# Patient Record
Sex: Male | Born: 1995 | Race: Asian | Hispanic: No | Marital: Single | Smoking: Never smoker
Health system: Southern US, Community
[De-identification: ages and names within clinical notes are randomized; demographics above are authoritative.]

## PROBLEM LIST (undated history)

## (undated) DIAGNOSIS — N3281 Overactive bladder: Secondary | ICD-10-CM

## (undated) HISTORY — PX: WISDOM TOOTH EXTRACTION: SHX21

---

## 2017-08-08 HISTORY — PX: OTHER SURGICAL HISTORY: SHX169

## 2021-01-07 ENCOUNTER — Emergency Department (HOSPITAL_COMMUNITY): Payer: No Typology Code available for payment source

## 2021-01-07 ENCOUNTER — Inpatient Hospital Stay (HOSPITAL_COMMUNITY): Payer: No Typology Code available for payment source

## 2021-01-07 ENCOUNTER — Inpatient Hospital Stay (HOSPITAL_COMMUNITY)
Admission: EM | Admit: 2021-01-07 | Discharge: 2021-01-10 | DRG: 101 | Disposition: A | Discharge status: Home / Self Care | Payer: No Typology Code available for payment source | Attending: Family Medicine | Admitting: Family Medicine

## 2021-01-07 DIAGNOSIS — E872 Acidosis: Secondary | ICD-10-CM | POA: Diagnosis present

## 2021-01-07 DIAGNOSIS — G40109 Localization-related (focal) (partial) symptomatic epilepsy and epileptic syndromes with simple partial seizures, not intractable, without status epilepticus: Secondary | ICD-10-CM

## 2021-01-07 DIAGNOSIS — N179 Acute kidney failure, unspecified: Secondary | ICD-10-CM | POA: Diagnosis present

## 2021-01-07 DIAGNOSIS — G9349 Other encephalopathy: Secondary | ICD-10-CM | POA: Diagnosis present

## 2021-01-07 DIAGNOSIS — G40901 Epilepsy, unspecified, not intractable, with status epilepticus: Secondary | ICD-10-CM | POA: Diagnosis present

## 2021-01-07 DIAGNOSIS — Z20822 Contact with and (suspected) exposure to covid-19: Secondary | ICD-10-CM | POA: Diagnosis present

## 2021-01-07 DIAGNOSIS — G40909 Epilepsy, unspecified, not intractable, without status epilepticus: Secondary | ICD-10-CM | POA: Diagnosis not present

## 2021-01-07 DIAGNOSIS — R509 Fever, unspecified: Secondary | ICD-10-CM | POA: Diagnosis not present

## 2021-01-07 DIAGNOSIS — R739 Hyperglycemia, unspecified: Secondary | ICD-10-CM | POA: Diagnosis present

## 2021-01-07 DIAGNOSIS — R6510 Systemic inflammatory response syndrome (SIRS) of non-infectious origin without acute organ dysfunction: Secondary | ICD-10-CM | POA: Diagnosis present

## 2021-01-07 DIAGNOSIS — R569 Unspecified convulsions: Secondary | ICD-10-CM

## 2021-01-07 DIAGNOSIS — G40409 Other generalized epilepsy and epileptic syndromes, not intractable, without status epilepticus: Secondary | ICD-10-CM

## 2021-01-07 DIAGNOSIS — E860 Dehydration: Secondary | ICD-10-CM | POA: Diagnosis present

## 2021-01-07 DIAGNOSIS — N3281 Overactive bladder: Secondary | ICD-10-CM | POA: Diagnosis present

## 2021-01-07 DIAGNOSIS — K529 Noninfective gastroenteritis and colitis, unspecified: Secondary | ICD-10-CM | POA: Diagnosis present

## 2021-01-07 DIAGNOSIS — F329 Major depressive disorder, single episode, unspecified: Secondary | ICD-10-CM | POA: Diagnosis present

## 2021-01-07 DIAGNOSIS — I472 Ventricular tachycardia: Secondary | ICD-10-CM | POA: Diagnosis not present

## 2021-01-07 HISTORY — DX: Overactive bladder: N32.81

## 2021-01-07 LAB — CBC WITH DIFFERENTIAL/PLATELET
Abs Immature Granulocytes: 0.63 10*3/uL — ABNORMAL HIGH (ref 0.00–0.07)
Basophils Absolute: 0.1 10*3/uL (ref 0.0–0.1)
Basophils Relative: 0 %
Eosinophils Absolute: 0.2 10*3/uL (ref 0.0–0.5)
Eosinophils Relative: 1 %
HCT: 53.1 % — ABNORMAL HIGH (ref 39.0–52.0)
Hemoglobin: 15.5 g/dL (ref 13.0–17.0)
Immature Granulocytes: 3 %
Lymphocytes Relative: 22 %
Lymphs Abs: 5.2 10*3/uL — ABNORMAL HIGH (ref 0.7–4.0)
MCH: 26.6 pg (ref 26.0–34.0)
MCHC: 29.2 g/dL — ABNORMAL LOW (ref 30.0–36.0)
MCV: 91.2 fL (ref 80.0–100.0)
Monocytes Absolute: 1.3 10*3/uL — ABNORMAL HIGH (ref 0.1–1.0)
Monocytes Relative: 6 %
Neutro Abs: 16.4 10*3/uL — ABNORMAL HIGH (ref 1.7–7.7)
Neutrophils Relative %: 68 %
Platelets: 349 10*3/uL (ref 150–400)
RBC: 5.82 MIL/uL — ABNORMAL HIGH (ref 4.22–5.81)
RDW: 12.3 % (ref 11.5–15.5)
WBC: 23.9 10*3/uL — ABNORMAL HIGH (ref 4.0–10.5)
nRBC: 0 % (ref 0.0–0.2)

## 2021-01-07 LAB — PROTIME-INR
INR: 1.2 (ref 0.8–1.2)
Prothrombin Time: 15.2 seconds (ref 11.4–15.2)

## 2021-01-07 LAB — RESP PANEL BY RT-PCR (FLU A&B, COVID) ARPGX2
Influenza A by PCR: NEGATIVE
Influenza B by PCR: NEGATIVE
SARS Coronavirus 2 by RT PCR: NEGATIVE

## 2021-01-07 LAB — URINALYSIS, ROUTINE W REFLEX MICROSCOPIC
Bacteria, UA: NONE SEEN
Bilirubin Urine: NEGATIVE
Glucose, UA: 50 mg/dL — AB
Ketones, ur: NEGATIVE mg/dL
Leukocytes,Ua: NEGATIVE
Nitrite: NEGATIVE
Protein, ur: NEGATIVE mg/dL
Specific Gravity, Urine: 1.013 (ref 1.005–1.030)
pH: 5 (ref 5.0–8.0)

## 2021-01-07 LAB — CSF CELL COUNT WITH DIFFERENTIAL
RBC Count, CSF: 1 /mm3 — ABNORMAL HIGH
RBC Count, CSF: 22 /mm3 — ABNORMAL HIGH
Tube #: 1
Tube #: 4
WBC, CSF: 1 /mm3 (ref 0–5)
WBC, CSF: 1 /mm3 (ref 0–5)

## 2021-01-07 LAB — ETHANOL: Alcohol, Ethyl (B): <10 mg/dL (ref <10)

## 2021-01-07 LAB — COMPREHENSIVE METABOLIC PANEL
ALT: 23 U/L (ref 0–44)
AST: 35 U/L (ref 15–41)
Albumin: 4.3 g/dL (ref 3.5–5.0)
Alkaline Phosphatase: 91 U/L (ref 38–126)
Anion gap: 22 — ABNORMAL HIGH (ref 5–15)
BUN: 16 mg/dL (ref 6–20)
CO2: 15 mmol/L — ABNORMAL LOW (ref 22–32)
Calcium: 9.1 mg/dL (ref 8.9–10.3)
Chloride: 100 mmol/L (ref 98–111)
Creatinine, Ser: 1.82 mg/dL — ABNORMAL HIGH (ref 0.61–1.24)
GFR, Estimated: 53 mL/min — ABNORMAL LOW (ref >60)
Glucose, Bld: 223 mg/dL — ABNORMAL HIGH (ref 70–99)
Potassium: 5 mmol/L (ref 3.5–5.1)
Sodium: 137 mmol/L (ref 135–145)
Total Bilirubin: 0.9 mg/dL (ref 0.3–1.2)
Total Protein: 7 g/dL (ref 6.5–8.1)

## 2021-01-07 LAB — LACTIC ACID, PLASMA
Lactic Acid, Venous: >11 mmol/L (ref 0.5–1.9)
Lactic Acid, Venous: 1.7 mmol/L (ref 0.5–1.9)

## 2021-01-07 LAB — RAPID URINE DRUG SCREEN, HOSP PERFORMED
Amphetamines: NOT DETECTED
Barbiturates: NOT DETECTED
Benzodiazepines: POSITIVE — AB
Cocaine: NOT DETECTED
Opiates: NOT DETECTED
Tetrahydrocannabinol: NOT DETECTED

## 2021-01-07 LAB — APTT: aPTT: 33 seconds (ref 24–36)

## 2021-01-07 LAB — GLUCOSE, CSF: Glucose, CSF: 62 mg/dL (ref 40–70)

## 2021-01-07 LAB — MAGNESIUM: Magnesium: 2 mg/dL (ref 1.7–2.4)

## 2021-01-07 LAB — PROTEIN, CSF: Total  Protein, CSF: 23 mg/dL (ref 15–45)

## 2021-01-07 MED ORDER — LORAZEPAM 2 MG/ML IJ SOLN
1.0000 mg | Freq: Once | INTRAMUSCULAR | Status: AC
  Administered 2021-01-07: 1 mg via INTRAVENOUS
  Filled 2021-01-07: qty 1

## 2021-01-07 MED ORDER — ACETAMINOPHEN 650 MG RE SUPP
650.0000 mg | Freq: Once | RECTAL | Status: AC
  Administered 2021-01-07: 650 mg via RECTAL
  Filled 2021-01-07: qty 1

## 2021-01-07 MED ORDER — DEXAMETHASONE SODIUM PHOSPHATE 10 MG/ML IJ SOLN
10.0000 mg | Freq: Once | INTRAMUSCULAR | Status: AC
Start: 2021-01-07 — End: 2021-01-07
  Administered 2021-01-07: 10 mg via INTRAVENOUS
  Filled 2021-01-07: qty 1

## 2021-01-07 MED ORDER — ENOXAPARIN SODIUM 40 MG/0.4ML IJ SOSY
40.0000 mg | PREFILLED_SYRINGE | INTRAMUSCULAR | Status: DC
  Administered 2021-01-08 – 2021-01-09 (×3): 40 mg via SUBCUTANEOUS
  Filled 2021-01-07 (×3): qty 0.4

## 2021-01-07 MED ORDER — LACTATED RINGERS IV BOLUS (SEPSIS)
1000.0000 mL | Freq: Once | INTRAVENOUS | Status: AC
  Administered 2021-01-07: 1000 mL via INTRAVENOUS

## 2021-01-07 MED ORDER — SODIUM CHLORIDE 0.9 % IV SOLN: INTRAVENOUS | Status: DC

## 2021-01-07 MED ORDER — DEXTROSE 5 % IV SOLN
10.0000 mg/kg | Freq: Three times a day (TID) | INTRAVENOUS | Status: DC
  Administered 2021-01-07 – 2021-01-09 (×6): 1000 mg via INTRAVENOUS
  Filled 2021-01-07 (×8): qty 20

## 2021-01-07 MED ORDER — LEVETIRACETAM IN NACL 1500 MG/100ML IV SOLN
1500.0000 mg | Freq: Once | INTRAVENOUS | Status: AC
  Administered 2021-01-07: 1500 mg via INTRAVENOUS
  Filled 2021-01-07: qty 100

## 2021-01-07 MED ORDER — VANCOMYCIN HCL 1750 MG/350ML IV SOLN
1750.0000 mg | Freq: Once | INTRAVENOUS | Status: AC
  Administered 2021-01-07: 1750 mg via INTRAVENOUS
  Filled 2021-01-07: qty 350

## 2021-01-07 MED ORDER — LACTATED RINGERS IV SOLN: INTRAVENOUS | Status: AC

## 2021-01-07 MED ORDER — SODIUM CHLORIDE 0.9 % IV SOLN
2.0000 g | Freq: Two times a day (BID) | INTRAVENOUS | Status: DC
  Administered 2021-01-08: 2 g via INTRAVENOUS
  Filled 2021-01-07: qty 2
  Filled 2021-01-07: qty 20

## 2021-01-07 MED ORDER — VANCOMYCIN HCL 1250 MG/250ML IV SOLN
1250.0000 mg | Freq: Two times a day (BID) | INTRAVENOUS | Status: DC
  Administered 2021-01-08: 1250 mg via INTRAVENOUS
  Filled 2021-01-07: qty 250

## 2021-01-07 MED ORDER — SODIUM CHLORIDE 0.9 % IV SOLN
2.0000 g | Freq: Once | INTRAVENOUS | Status: AC
  Administered 2021-01-07: 2 g via INTRAVENOUS
  Filled 2021-01-07: qty 20

## 2021-01-07 MED ORDER — ACETAMINOPHEN 325 MG RE SUPP
325.0000 mg | Freq: Once | RECTAL | Status: AC
  Administered 2021-01-07: 325 mg via RECTAL
  Filled 2021-01-07: qty 1

## 2021-01-07 MED ORDER — SODIUM CHLORIDE 0.9 % IV BOLUS: 1000.0000 mL | Freq: Once | INTRAVENOUS | Status: DC

## 2021-01-07 MED ORDER — LACTATED RINGERS IV BOLUS (SEPSIS)
500.0000 mL | Freq: Once | INTRAVENOUS | Status: AC
Start: 2021-01-07 — End: 2021-01-07
  Administered 2021-01-07: 500 mL via INTRAVENOUS

## 2021-01-07 NOTE — ED Notes (Signed)
Attempted to call report

## 2021-01-07 NOTE — Progress Notes (Signed)
NAME:  George Valentine, MRN:  [id number], DOB:  17-Jan-1996, LOS: 0 ADMISSION DATE:  01/07/2021, CONSULTATION DATE:  01/07/2021 REFERRING MD:  Graciella Freer, PA, CHIEF COMPLAINT:  HA/ AMS/ seizure  History of Present Illness:  HPI obtained from medical chart review, and per mother at beside (speaks some [place]), and sister by phone as patient remains encephalopathic/ post-ictal.   25 year old male with hx of febrile seizures at the age of 3 and additional seizure in high school but not on any AEDs, presenting with AMS/ witness seizure after patient complained of headache while at the gym at [facility].  Became unresponsive, noted to be seizing on EMS arrival, treated with versed and nasal trumpet inserted for snoring respirations.    Family reports patient was in his normal state of health as of this morning.  No recent complaints or known sick contacts.  Mother at bedside states he is healthy otherwise, denies ETOH, tobacco or illicit drug abuse   Was noted to be febrile on arrival 102.2, normotensive, tachycardic and currently on on room air with normal O2 saturations.  Witnessed seizure again in ER with left gaze s/p ativan.  Labs significant for bicarb 15, glucose 223, sCr 1.82, lactic acid >11, WBC 23.9, normal coags, COVID/ flu neg, ETOH neg, CXR normal, EKG ST/ non acute,  CT head neg.  EEG just completed.  LP completed so far - noted to be clear, glucose 62, RBC 22-> 1, protein 23, with HSV, gram stain, and culture pending.  Neurology consulted.  Loaded with keppra.  Cultures sent and empiric ceftriaxone/ vanc started with acyclovir and decadron.  S/p 3.5L NS.    PCCM called to evaluate for possible ICU admission.   Pertinent  Medical History  unknown  Significant Hospital Events: Including procedures, antibiotic start and stop dates in addition to other pertinent events    [age] presented with HA/ unresponsive/ witnessed seizure/ fever, AKI, lactic acidosis.  Pan cultured.  CTH neg.  LP  performed, empiric abx- vanc, ceftriaxone, acyclovir, decadron, neurology consulted.  EEG pending.  Interim History / Subjective:  EEG finishing.    Objective   Blood pressure 116/61, pulse (!) 107, temperature (!) 102.2 F (39 C), temperature source Rectal, resp. rate 20, height 6' (1.829 m), weight 99.8 kg, SpO2 93 %.    >       No intake or output data in the 24 hours ending 01/07/21 1823    Filed Weights   01/07/21 1600  Weight: 99.8 kg    Examination: General: Somnolent but arousable to command HEENT: MM pink/moist; PERRL Neuro: Opens eyes and moves UE and LE to command. Still somnolent but recently received dose of Ativan CV: s1s2, no m/r/g PULM:  Dim clear BS bilaterally on RA GI: soft, bsx4 active  Extremities: warm/dry, no edema  Skin: no rashes or lesions   Labs/imaging that I havepersonally reviewed  (right click and "Reselect all SmartList Selections" daily)   - CBC, BMET, ETOH, CTH, LP, SARS, CTH, CXR, EKG   Resolved Hospital Problem list     Assessment & Plan:   Seizure, hx of childhood febrile seizures Possible Mengitis AKI with AGMA,  Lactic acidosis: likely related to seizure Leukocytosis; fever SIRS Hyperglycemia Plan: -Patient is currently hemodynamically stable and protecting airway. Not ICU needs at this time -Neuro consulted -EEG in place; CT Head performed  -CSF cultures pending: treating ppx with Vanc, ceftriaxone, acyclovir, and decadron -prn tylenol -follow cultures -UDS, CK, UA and repeat lactate pending  Discussed with Lou Cal in ER remain per her primary/admitting team. PCCM available as needed   Labs   CBC: Recent Labs  Lab 01/07/21 1516  WBC 23.9*  NEUTROABS 16.4*  HGB 15.5  HCT 53.1*  MCV 91.2  PLT 349    Basic Metabolic Panel: Recent Labs  Lab 01/07/21 1516  NA 137  K 5.0  CL 100  CO2 15*  GLUCOSE 223*  BUN 16  CREATININE 1.82*  CALCIUM 9.1  MG 2.0   GFR: Estimated Creatinine Clearance:  76.6 mL/min (A) (by C-G formula based on SCr of 1.82 mg/dL (H)). Recent Labs  Lab 01/07/21 1516 01/07/21 1520  WBC 23.9*  --   LATICACIDVEN  --  >11.0*    Liver Function Tests: Recent Labs  Lab 01/07/21 1516  AST 35  ALT 23  ALKPHOS 91  BILITOT 0.9  PROT 7.0  ALBUMIN 4.3   No results for input(s): LIPASE, AMYLASE in the last 168 hours. No results for input(s): AMMONIA in the last 168 hours.  ABG No results found for: PHART, PCO2ART, PO2ART, HCO3, TCO2, ACIDBASEDEF, O2SAT   Coagulation Profile: Recent Labs  Lab 01/07/21 1516  INR 1.2    Cardiac Enzymes: No results for input(s): CKTOTAL, CKMB, CKMBINDEX, TROPONINI in the last 168 hours.  HbA1C: No results found for: HGBA1C  CBG: No results for input(s): GLUCAP in the last 168 hours.  Review of Systems:   See HPI. Information gathered from chart, bedside nurse, and family at bedside  Past Medical History:  He,  has no past medical history on file.   Surgical History:     Social History:      Family History:  His family history is not on file.   Allergies Not on File   Home Medications  Prior to Admission medications   Not on File     Critical care time: 88    JD Anselm [facility] 01/07/2021, 7:06 PM  Please see [email or web address] for pager details.  From 7A-7P if no response, please call [phone]. After hours, please call ELink [phone].

## 2021-01-07 NOTE — ED Notes (Signed)
Pt becoming more alert. Trying to climb out of the bed. Assisted back into bed. Pt not tolerating monitoring at this time.

## 2021-01-07 NOTE — Progress Notes (Signed)
EEG complete - results pending 

## 2021-01-07 NOTE — ED Triage Notes (Signed)
Pt bib ems from school gym, pt asked staff for tylenol, then sat down in chair. Pt had full body seizure with EMS, given 5mg  IM versed. Pt remains unresponsive. Pt on NRB at 90%, CBG 90. HR ST 148.

## 2021-01-07 NOTE — Progress Notes (Signed)
Responded to ED page to support patient who was brought to ED unresponsive after experiencing seizure and taking tylenol.  A friend got in contact with a cousin but no one has call to ask about him. Patient had not been here before.  Provided prayer and emotional support to patient and staff. Chaplain available as needed.  Venida Jarvis, [place], [facility], Pager [phone]

## 2021-01-07 NOTE — ED Notes (Signed)
RN attempted to call report 

## 2021-01-07 NOTE — ED Notes (Signed)
NRB removed. Pt oxygen saturation remains 94% and above. PA at the bedside updating family.

## 2021-01-07 NOTE — ED Notes (Signed)
Neuro at the bedside.

## 2021-01-07 NOTE — H&P (Addendum)
Family Medicine Teaching [place] Admission History and Physical Service Pager: [phone]  Patient name: George Valentine Medical record number: [id number] Date of birth: [birth year] Age: 25 y.o. Gender: male  Primary Care Provider: Pcp, No Consultants: Neuro Code Status: Full Preferred Emergency Contact: Mother  Chief Complaint: Seizure  Assessment and Plan: George Valentine is a 25 y.o. male presenting with seixure. PMH is significant for MDD, Overactive Bladder, and Flexural Exema.   Seizure-like activity with Sepsis r/o Patient presented after having seizure-like episode at gym around 1400.  Got Versed during EMS transport which resolved seizure. In ED had repeat seizure-like episodes. Received Ativan x2, Dexamethesone x1, and Keppra x1.  Received 3.5 1L bolus of LR. Unknown total duration of seizures.  Hemodynamically stable.  Currently Afebrile, mildly tachycardic in 100's.  On exam, patient somewhat somnolent but can be aroused to speech and answers questions appropriately.  Likely due to post-ictal state and benzodiazapoene administration. Neuro exam normal and no nuchal rigidity.  WBC elevated at 23.9.  Absolute neutrophils elevated at 16.3, rest of absolute lines also elevated.  Mg normal.  TSH on 10/21 was 1.401.  Ethanol negative and UDS negative outside of benzodiazapenes.   Patient denies any drug use or sexual activity.  Denies taking any medications but does use creatine and pre-workout.  Initial lactic acid elevated >11, but repeat PT, PTT normal.  BCx, UCx and CSF analysis all obtained.  CSF Glucose and Protein normal.  Neuro consulted and following.  CCM saw and believed stepdown care appropriate.  Head CT shows no active bleed.  Concern for Bacterial vs. Viral meningitis given fevers and prolonged seizures.   Currently on Vancomycin, Ceftriaxone, and Acyclovir.  Patient does indicate having one episode of seizure when younger and chart history of febrile seizures as young child.   Possible due to undiagnosed epilepsy.  Given workout and creatine usage, elevated Temp and tachycardia concern for dehydration/heat exhaustion, lowering seizure threshold causing this.  No sign of any active bleed on CT exam so less concern for Hematologic process.  Admission for observation and continued seizure work-up.        - Admit to inpatient, FPTS, Attending Dr Deirdre Priest - Neuro following, appreciate recs - Continue IV Acyclovir until HSV results - Deescalate  Antibiotics as appropriate - follow up BCx, UCx, CSF culture, HSV and MRI - follow up EEG results - No Ativan PRN ordered at this time - AM BMP, CBC, and A1C - CK BID - continue mIV LR  Overactive Bladder Has Permanent InterStim bladder [employer] device placed on 04/30/2018.  Sees Urologist yearly.    - Obtain A1C  FEN/GI: NPO Prophylaxis: Lovenox   Disposition: Progressive  History of Present Illness:  George Valentine is a 25 y.o. male presenting with seizure.  Indicates was at the gym around 2 PM this afternoon and started having having chills headache and dizziness.  Was weight lifting at the time.  Had 4 episodes of diarrhea earlier in day after eating [place] food night before.  Denies any head trauma.  Denies any sexual activity or drug use.  Does not use any medications but uses Pre-Workout and Creatine.  Endorses headache.  Denies any neck pain or stiffness.  Does endorse being around friend with possible recent viral illness earlier this week.  No other sick contacts. Indicates has had 1 episode of seizure 7 years ago.  Review Of Systems: Per HPI with the following additions:  Review of Systems  Constitutional: Positive for activity change and chills.  Respiratory: Negative for chest tightness and shortness of breath.   Cardiovascular: Negative for chest pain.  Gastrointestinal: Positive for diarrhea. Negative for nausea and vomiting.  Musculoskeletal: Negative for neck pain and neck stiffness.  Neurological:  Positive for dizziness and seizures.  Psychiatric/Behavioral: Positive for confusion.     Patient Active Problem List   Diagnosis Date Noted  . Seizure (HCC) 01/07/2021  . Status epilepticus [facility])     Past Medical History: No past medical history on file.  Past Surgical History:   Social History:   Additional social history: Not currently in school or has longt Please also refer to relevant sections of EMR.  Family History: No family history on file.  No reported Family Hx of seizures  Allergies and Medications: No Known Allergies No current facility-administered medications on file prior to encounter.   Current Outpatient Medications on File Prior to Encounter  Medication Sig Dispense Refill  . mometasone (ELOCON) 0.1 % cream Apply 1 application topically See admin instructions. Apply to affected area two times a day for no more than 2 weeks      Objective: BP (!) 111/54   Pulse (!) 106   Temp (!) 102.2 F (39 C) (Rectal)   Resp (!) 24   Ht 6' (1.829 m)   Wt 99.8 kg   SpO2 99%   BMI 29.84 kg/m  Exam:  Physical Exam Constitutional:      General: He is not in acute distress.    Appearance: Normal appearance. He is not diaphoretic.     Comments: Answers questions appropriately, somewhat somnolent  HENT:     Head: Normocephalic and atraumatic.     Mouth/Throat:     Mouth: Mucous membranes are moist.  Eyes:     Pupils: Pupils are equal, round, and reactive to light.  Cardiovascular:     Rate and Rhythm: Normal rate and regular rhythm.     Pulses: Normal pulses.  Pulmonary:     Effort: Pulmonary effort is normal.     Breath sounds: Normal breath sounds.  Musculoskeletal:     Cervical back: Normal range of motion. No rigidity or tenderness.     Right lower leg: No edema.     Left lower leg: No edema.  Lymphadenopathy:     Cervical: No cervical adenopathy.  Skin:    General: Skin is warm.  Neurological:     General: No focal deficit present.      Mental Status: He is oriented to person, place, and time.     Cranial Nerves: No cranial nerve deficit.     Motor: No weakness.  Psychiatric:        Mood and Affect: Mood normal.     Labs and Imaging: CBC BMET  Recent Labs  Lab 01/07/21 1516  WBC 23.9*  HGB 15.5  HCT 53.1*  PLT 349   Recent Labs  Lab 01/07/21 1516  NA 137  K 5.0  CL 100  CO2 15*  BUN 16  CREATININE 1.82*  GLUCOSE 223*  CALCIUM 9.1     EKG: Sinus Ruthine Dose, MD 01/07/2021, 9:37 PM PGY-1, [facility] Family Medicine FPTS Intern pager: [phone], text pages welcome

## 2021-01-07 NOTE — Consult Note (Signed)
NEUROHOSPITALISTS-CONSULTATION NOTE    Requesting Physician: Dr. Particia Nearing   Admit date: 01/07/21     Chief Complaint: Seizure    History obtained from:  Patient/Mother/Chart Review    HPI   George Valentine is a 25 year old male w/pmh of overactive bladder treated with implantation of permanent InterSim in 2019, depression, difficulty concentrating who presents with seizure witnessed by EMS.   He was at the [facility] gym and went to ask for some tylenol because he was not feeling well. After asking for tylenol he became unresponsive thus EMS was called. When EMS arrived he was seizing.   Per ED documentation he had another seizure while in the ER:  Was noted to be febrile on arrival 102.2, normotensive, tachycardic and currently on on room air with normal O2 saturations. Witnessed seizure again in ER with left gaze s/p ativan. Labs significant for bicarb 15, glucose 223, sCr 1.82, lactic acid >11, WBC 23.9, normal coags, COVID/ flu neg, ETOH neg, CXR normal, EKG ST/ non acute, CT head neg. EEG just completed. LP completed so far - noted to be clear, glucose 62, RBC 22->1, protein 23, with HSV, gram stain, and culture pending. Neurology consulted. Loaded with keppra. Cultures sent and empiric ceftriaxone/ vanc started with acyclovir and decadron. S/p 3.5L NS.   Spoke at length with the patients mother via Comptroller- she states he has never been diagnosed with epilepsy. He had two febrile seizures at the age of 43. She notes that at the age of 69 in the 11th grade he passed out while in class for 2 m then roused easily. He was seen by the school nurse that day but was never evaluated by a medical professional.   He lives at home with his mother and recently graduated. Starting a new job soon.   Mother states that this past week he has been his normal self. Apparently has been staying up late till 2 AM to play video games despite her urging him to have healthier sleeping  habits.   Mom denies recent fever, chills, night sweats, headache, neck pain, syncopal events in the past week. She also denies a history of head trauma.    Past Medical History    OAB, Depression, Difficulty Concentrating   Past Surgical History  None    Family History   No family history of epilepsy   Social History  Social History:  has no history on file for tobacco use, alcohol use, and drug use. Unable to assess secondary to patient's mental status    Allergies  Allergies: Not on File   Medications Prior to Admission   No current outpatient medications   Current Facility-Administered Medications:  .  acyclovir (ZOVIRAX) 1,000 mg in dextrose 5 % 150 mL IVPB, 10 mg/kg, Intravenous, Q8H, Kyung Rudd, RPH, Last Rate: 170 mL/hr at 01/07/21 1851, 1,000 mg at 01/07/21 1851 .  [START ON 01/08/2021] cefTRIAXone (ROCEPHIN) 2 g in sodium chloride 0.9 % 100 mL IVPB, 2 g, Intravenous, Q12H, Kyung Rudd, RPH .  lactated ringers infusion, , Intravenous, Continuous, Jacalyn Lefevre, MD, Last Rate: 150 mL/hr at 01/07/21 1851, New Bag at 01/07/21 1851 .  [START ON 01/08/2021] vancomycin (VANCOREADY) IVPB 1250 mg/250 mL, 1,250 mg, Intravenous, Q12H, Kyung Rudd, RPH .  vancomycin (VANCOREADY) IVPB 1750 mg/350 mL, 1,750 mg, Intravenous, Once, Kyung Rudd [facility], Last Rate: 175 mL/hr at 01/07/21 1852, 1,750 mg at 01/07/21 1852 No current outpatient medications on file.    ROS  History obtained from the patient General ROS: positive for fever noted upon presenting to the ER  Psychological ROS: negative for - behavioral disorder, hallucinations, memory difficulties, mood swings or suicidal ideation Ophthalmic ROS: negative for - blurry vision, double vision, eye pain or loss of vision ENT ROS: negative for - epistaxis, nasal discharge, oral lesions, sore throat, tinnitus or vertigo Allergy and Immunology ROS: negative for - hives or itchy/watery eyes Hematological and Lymphatic ROS:  negative for - bleeding problems, bruising or swollen lymph nodes Endocrine ROS: negative for - galactorrhea, hair pattern changes, polydipsia/polyuria or temperature intolerance Respiratory ROS: negative for - cough, hemoptysis, shortness of breath or wheezing Cardiovascular ROS: negative for - chest pain, dyspnea on exertion, edema or irregular heartbeat Gastrointestinal ROS: negative for - abdominal pain, diarrhea, hematemesis, nausea/vomiting or stool incontinence Genito-Urinary ROS: negative for - dysuria, hematuria, incontinence or urinary frequency/urgency Musculoskeletal ROS: negative for - joint swelling or muscular weakness Neurological ROS: as noted in HPI Dermatological ROS: negative for rash and skin lesion changes   Physical Examination          Vitals:    10/29/17 1445 10/29/17 1515 10/29/17 1545 10/29/17 1600  BP: 103/68 (!) 93/58 118/76 (!) 120/101  Pulse: (!) 58 (!) 56 (!) 59 (!) 59  Resp: 17 15 16 14   Temp:          TempSrc:          SpO2: 98% 99% 100% 100%  Weight:          Height:             Gen --well-developed and well-nourished Psychiatric -not interactive HEENT-  Normocephalic, no nuchal rigidity, atraumatic Cardiovascular -regular rhythm, tachycardic Respiratory -  Non-labored breathing, No wheezing, nonrebreather in place. Abdomen - soft and non-tender, obese Extremities- no edema or cyanosis Skin-warm and dry, some facial acne Musculoskeletal -no obvious joint deformities   Neurological Examination  Current vital signs: BP (!) 111/54   Pulse (!) 104   Temp (!) 102.2 F (39 C) (Rectal)   Resp 19   Ht 6' (1.829 m)   Wt 99.8 kg   SpO2 98%   BMI 29.84 kg/m  Vital signs in last 24 hours: Temp:  [102.2 F (39 C)] 102.2 F (39 C) (06/02 1526) Pulse Rate:  [104-155] 104 (06/02 1915) Resp:  [18-30] 19 (06/02 1915) BP: (103-184)/(52-109) 111/54 (06/02 1915) SpO2:  [91 %-100 %] 98 % (06/02 1915) Weight:  [99.8 kg] 99.8 kg (06/02  1600)  Neurological Examination Mental Status: Obtunded, unable to assess LOC  CN: + Oculocephalic reflex, pupils equal round and reactive to light, + Corneals, symmetric grimace, intact cough/gag Motor: Withdrawing to noxious stimulation. Unable to do formal strength testing due to AMS, on later examination was trying to climb out of bed and moving all extremities grossly equally Deep Tendon Reflexes: UTA due to movement during examination  Cerebellar: UTA due to AMS  Gait: Deferred due to AMS   On repeated examinations, patient was gradually more and more responsive   Laboratory Results  CBC pertinent for white count at 23.9 CMP with elevated Cr 1.82 Lactic Acid elevated > 11  Ethanol undetectable UDS, Blood Cultures, Urine Culture pending   CSF:  RBC 22/1, WBC 1/1, clear fluid Protein 23, Glucose 62, Culture w/no organisms seen + WBC   Imaging Results   CT Head 01/07/21 personally reviewed by attending physician -No acute intracranial process  Chest x-ray without acute process   IMPRESSION AND PLAN  George Valentine is a 25 year old male w/pmh of overactive bladder, Depression, Difficulty Concentrating, Febrile Seizures who presents with two seizures. Physical examination currently limited given he is obtunded in the setting of received Ativan + Versed. Given fever noted upon arrival + leukocytosis its likely that seizure is in the setting of an infectious process.It is also possible that he has undiagnosed epilepsy given the classroom event that occurred at the age of 30. Meningitis work up has been initiated with pending HSV and he has been started empirically on Vanc, Ceftriaxone and Acyclovir. Routine EEG was also ordered as a part of seizure work up.   Recommendations:  -In addition to antibiotics initiated by ED team, suggest acyclovir to cover potential HSV  -Given reassuring CSF studies and negative gram stain, okay to discontinue antibiotics except for acyclovir which should  be continued until HSV returns negative - Routine EEG results pending - MRI brain with and without contrast  -Trend CK to peak - Neurology will continue to follow   George Jock, NP  [facility]  Patient seen and discussed with attending physician Dr. Artist Beach MD-PhD [facility] [phone] Available 7 AM to 7 PM, outside these hours please contact Neurologist on call listed on AMION

## 2021-01-07 NOTE — ED Notes (Signed)
Family w/ pt

## 2021-01-07 NOTE — ED Provider Notes (Signed)
[facility]   CSN: [id number] Arrival date & time: 01/07/21  1513     History No chief complaint on file.   George LeydenJoseph Valentine is a 25 y.o. male brought in by EMS for evaluation of seizure, altered mental status.  [facility] and went up to ice her substance for some Tylenol and became unresponsive.  On EMS arrival, he was seizing.  He was given Versed and was still altered, unresponsive.  Nasal trumpet was inserted to protect airway.  Patient was snoring respirations.  EM LEVEL 5 CAVEAT DUE TO AMS  The history is provided by the EMS personnel.       No past medical history on file.  Patient Active Problem List   Diagnosis Date Noted  . Status epilepticus (HCC)     The histories are not reviewed yet. Please review them in the "History" navigator section and refresh this SmartLink.     No family history on file.     Home Medications Prior to Admission medications   Not on File    Allergies    Patient has no allergy information on record.  Review of Systems   Review of Systems  Unable to perform ROS: Mental status change    Physical Exam Updated Vital Signs BP (!) 111/54   Pulse (!) 104   Temp (!) 102.2 F (39 C) (Rectal)   Resp 19   Ht 6' (1.829 m)   Wt 99.8 kg   SpO2 98%   BMI 29.84 kg/m   Physical Exam Vitals and nursing note reviewed.  Constitutional:      Appearance: Normal appearance. He is well-developed. He is ill-appearing.     Comments: Snoring respirations   HENT:     Head: Normocephalic and atraumatic.  Eyes:     General: Lids are normal.     Conjunctiva/sclera: Conjunctivae normal.     Pupils: Pupils are equal, round, and reactive to light.     Comments: Pupils 2-3 mm bilaterally.   Neck:     Comments: No obvious neck rigidity Cardiovascular:     Rate and Rhythm: Regular rhythm. Tachycardia present.     Pulses: Normal pulses.          Radial pulses are 2+ on the right  side and 2+ on the left side.       Dorsalis pedis pulses are 2+ on the right side and 2+ on the left side.     Heart sounds: Normal heart sounds. No murmur heard. No friction rub. No gallop.   Pulmonary:     Effort: Pulmonary effort is normal.     Breath sounds: Normal breath sounds.     Comments: Lungs clear to auscultation bilaterally.  Symmetric chest rise.  No wheezing, rales, rhonchi. Abdominal:     Palpations: Abdomen is soft. Abdomen is not rigid.     Tenderness: There is no abdominal tenderness. There is no guarding.     Comments: Abdomen is soft, non-distended, non-tender. No rigidity, No guarding. No peritoneal signs.  Musculoskeletal:        General: Normal range of motion.  Skin:    General: Skin is warm and dry.     Capillary Refill: Capillary refill takes less than 2 seconds.  Neurological:     Mental Status: He is unresponsive.     Comments: Unresponsive with snoring respirations Intermittent fasciculations  Psychiatric:     Comments: Unable to assess  ED Results / Procedures / Treatments   Labs (all labs ordered are listed, but only abnormal results are displayed) Labs Reviewed  CBC WITH DIFFERENTIAL/PLATELET - Abnormal; Notable for the following components:      Result Value   WBC 23.9 (*)    RBC 5.82 (*)    HCT 53.1 (*)    MCHC 29.2 (*)    Neutro Abs 16.4 (*)    Lymphs Abs 5.2 (*)    Monocytes Absolute 1.3 (*)    Abs Immature Granulocytes 0.63 (*)    All other components within normal limits  COMPREHENSIVE METABOLIC PANEL - Abnormal; Notable for the following components:   CO2 15 (*)    Glucose, Bld 223 (*)    Creatinine, Ser 1.82 (*)    GFR, Estimated 53 (*)    Anion gap 22 (*)    All other components within normal limits  LACTIC ACID, PLASMA - Abnormal; Notable for the following components:   Lactic Acid, Venous >11.0 (*)    All other components within normal limits  CSF CELL COUNT WITH DIFFERENTIAL - Abnormal; Notable for the following  components:   RBC Count, CSF 22 (*)    All other components within normal limits  CSF CELL COUNT WITH DIFFERENTIAL - Abnormal; Notable for the following components:   RBC Count, CSF 1 (*)    All other components within normal limits  RESP PANEL BY RT-PCR (FLU A&B, COVID) ARPGX2  CSF CULTURE W GRAM STAIN  CULTURE, BLOOD (ROUTINE X 2)  CULTURE, BLOOD (ROUTINE X 2)  URINE CULTURE  MAGNESIUM  ETHANOL  LACTIC ACID, PLASMA  PROTIME-INR  APTT  GLUCOSE, CSF  PROTEIN, CSF  URINALYSIS, ROUTINE W REFLEX MICROSCOPIC  RAPID URINE DRUG SCREEN, HOSP PERFORMED  HSV 1/2 PCR, CSF  CK  CK  CBG MONITORING, ED    EKG EKG Interpretation  Date/Time:  Thursday January 07 2021 15:50:18 EDT Ventricular Rate:  144 PR Interval:  137 QRS Duration: 89 QT Interval:  278 QTC Calculation: 431 R Axis:   57 Text Interpretation: Sinus tachycardia No old tracing to compare Confirmed by Jacalyn Lefevre [id number]) on 01/07/2021 3:57:08 PM   Radiology CT HEAD WO CONTRAST  Result Date: 01/07/2021 CLINICAL DATA:  Recent seizure activity with decreased responsiveness EXAM: CT HEAD WITHOUT CONTRAST TECHNIQUE: Contiguous axial images were obtained from the base of the skull through the vertex without intravenous contrast. COMPARISON:  None. FINDINGS: Brain: No evidence of acute infarction, hemorrhage, hydrocephalus, extra-axial collection or mass lesion/mass effect. Vascular: No hyperdense vessel or unexpected calcification. Skull: Normal. Negative for fracture or focal lesion. Sinuses/Orbits: No acute finding. Other: None. IMPRESSION: No acute intracranial abnormality noted. Electronically Signed   By: Alcide Clever M.D.   On: 01/07/2021 16:52   DG Chest Port 1 View  Result Date: 01/07/2021 CLINICAL DATA:  Seizure.  Unresponsive. EXAM: PORTABLE CHEST 1 VIEW COMPARISON:  None. FINDINGS: The heart size and mediastinal contours are within normal limits. Both lungs are clear. The visualized skeletal structures are unremarkable.  IMPRESSION: No active disease. Electronically Signed   By: Obie Dredge M.D.   On: 01/07/2021 15:30    Procedures .Lumbar Puncture  Date/Time: 01/07/2021 4:06 PM Performed by: Maxwell Caul, PA-C Authorized by: Maxwell Caul, PA-C   Consent:    Consent obtained:  Emergent situation   Risks, benefits, and alternatives were discussed: not applicable   Universal protocol:    Procedure explained and questions answered to patient or proxy's satisfaction: no  Relevant documents present and verified: yes     Test results available: yes     Imaging studies available: no     Required blood products, implants, devices, and special equipment available: yes     Immediately prior to procedure a time out was called: yes     Site/side marked: yes     Patient identity confirmed:  Arm band Pre-procedure details:    Procedure purpose:  Diagnostic   Preparation: Patient was prepped and draped in usual sterile fashion   Anesthesia:    Anesthesia method:  None Procedure details:    Lumbar space:  L4-L5 interspace   Patient position:  L lateral decubitus   Needle gauge:  18   Needle type:  Spinal needle - Quincke tip   Needle length (in):  1   Ultrasound guidance: no     Number of attempts:  1   Fluid appearance:  Clear   Tubes of fluid:  4   Total volume (ml):  4 Post-procedure details:    Puncture site:  Adhesive bandage applied   Procedure completion:  Tolerated Comments:     Due to the emergent nature of the procedure as well as the fact that patient was altered, verbal consent was not able to be obtained.   .Critical Care Performed by: Maxwell Caul, PA-C Authorized by: Maxwell Caul, PA-C   Critical care provider statement:    Critical care time (minutes):  45   Critical care was necessary to treat or prevent imminent or life-threatening deterioration of the following conditions:  Metabolic crisis and CNS failure or compromise   Critical care was time spent  personally by me on the following activities:  Discussions with consultants, evaluation of patient's response to treatment, examination of patient, ordering and performing treatments and interventions, ordering and review of laboratory studies, ordering and review of radiographic studies, pulse oximetry, re-evaluation of patient's condition, obtaining history from patient or surrogate and review of old charts     Medications Ordered in ED Medications  lactated ringers infusion ( Intravenous New Bag/Given 01/07/21 1851)  vancomycin (VANCOREADY) IVPB 1750 mg/350 mL (1,750 mg Intravenous New Bag/Given 01/07/21 1852)  acyclovir (ZOVIRAX) 1,000 mg in dextrose 5 % 150 mL IVPB (1,000 mg Intravenous New Bag/Given 01/07/21 1851)  cefTRIAXone (ROCEPHIN) 2 g in sodium chloride 0.9 % 100 mL IVPB (has no administration in time range)  vancomycin (VANCOREADY) IVPB 1250 mg/250 mL (has no administration in time range)  acetaminophen (TYLENOL) suppository 650 mg (650 mg Rectal Given 01/07/21 1532)  acetaminophen (TYLENOL) suppository 325 mg (325 mg Rectal Given 01/07/21 1532)  lactated ringers bolus 1,000 mL (0 mLs Intravenous Stopped 01/07/21 1716)    And  lactated ringers bolus 1,000 mL (0 mLs Intravenous Stopped 01/07/21 1716)    And  lactated ringers bolus 1,000 mL (1,000 mLs Intravenous New Bag/Given 01/07/21 1851)    And  lactated ringers bolus 500 mL (500 mLs Intravenous New Bag/Given 01/07/21 1851)  dexamethasone (DECADRON) injection 10 mg (10 mg Intravenous Given 01/07/21 1532)  cefTRIAXone (ROCEPHIN) 2 g in sodium chloride 0.9 % 100 mL IVPB (0 g Intravenous Stopped 01/07/21 1652)  levETIRAcetam (KEPPRA) IVPB 1500 mg/ 100 mL premix (0 mg Intravenous Stopped 01/07/21 1543)  LORazepam (ATIVAN) injection 1 mg (1 mg Intravenous Given 01/07/21 1531)  LORazepam (ATIVAN) injection 1 mg (1 mg Intravenous Given 01/07/21 1747)    ED Course  I have reviewed the triage vital signs and the nursing notes.  Pertinent labs &  imaging  results that were available during my care of the patient were reviewed by me and considered in my medical decision making (see chart for details).    MDM Rules/Calculators/A&P                          25 year old male brought in by EMS for evaluation of seizure activity.  Patient was at the gym working out and went to the dentist asked for Tylenol and became unresponsive.  On EMS arrival, patient was having active grand mal seizure.  Patient was given Versed and continued to remain unresponsive/altered.  Nasal trumpet was inserted.  On initial arrival, patient is unresponsive, track protecting his airway with snoring respirations.  He has a rectal temperature of 102.2.  He is tachycardic, tachypneic.  Concern for sepsis.  Given fever, seizure concern clinically for meningitis.  During my evaluation, patient had brief episode where he was blinking but still unresponsive.  He then started gazing to the side and started having fasciculations.  Concerned that he has continued seizure activity.  Patient given Keppra, Ativan.  Rectal tylenol given.Additionally, patient started on antibiotics (Rocephin, vancomycin).  Patient given fluid bolus.  CBC shows leukocytosis of 23.9. Hgb stable at 15.5. Lactic is > 11.0. CMP shows glucose of 223. BUN of 16, Cr of 1.82. ETOH <10. Mag is 2.0.   Discussed patient with Dr. Iver Nestle (Neuro) who will evaluate the patient.   Given emergent concerns of meningitis, fever, LP was performed as documented above.  CXR negative. CT negative.   Mom at bedside. She states that patient had a seizure when he was 18. He was not started on any medications.   Patient started become agitated, trying to get out of bed.  Still altered.  Please give additional Ativan.  I discussed with critical care.  They will come evaluate the patient.  CSF gram stain shows no evidence of organisms.   Discussed with critical care.  They feel that patient is appropriate for progressive.  They will  plan to consult as needed.  Dicsussed with family medicine admitting team. They will admit the patient.   Portions of this note were generated with Scientist, clinical (histocompatibility and immunogenetics). Dictation errors may occur despite best attempts at proofreading.   Final Clinical Impression(s) / ED Diagnoses Final diagnoses:  Status epilepticus (HCC)  Fever, unspecified fever cause    Rx / DC Orders ED Discharge Orders    None       Rosana Hoes 01/07/21 2018    Jacalyn Lefevre, MD 01/07/21 2156

## 2021-01-07 NOTE — ED Notes (Signed)
Pt to MRI

## 2021-01-07 NOTE — ED Notes (Signed)
Attempted to give report again. 

## 2021-01-07 NOTE — ED Notes (Signed)
RN went to MRI to transport pt to floor to give bedside report.  Vitals were done as soon as RN arrived, were WNL which tec documented.  Bedside report was given.

## 2021-01-07 NOTE — Progress Notes (Signed)
Pharmacy Antibiotic Note  George Valentine is a 25 y.o. male admitted on 01/07/2021 with meningitis.  Pharmacy has been consulted for vancomycin, ceftriaxone and acyclovir dosing.  Patient presented post seizure, unresponsive, fevering and flushed. LP and Blood cultures obtained prior to antibiotics. Tmax 102.2, leukocytosis (23.9), high lactic acid in the presence of seizures (11.0), and AKI (Scr 1.82). WBC seen on LP.  Given several fluid boluses and on LR at 150, so adequately hydrated with acyclovir.   Plan: Vancomycin 1750mg  loading dose IV Then vancomycin 1250mg  IV q12 (goal trough 15-20) Acyclovir 1000mg  every 8 hours IV Ceftriaxone 2g q12hr IV Follow up cultures, LP results, renal function accurate weight and clinical picture.  Height: 6' (182.9 cm) Weight: 99.8 kg (220 lb) IBW/kg (Calculated) : 77.6  Temp (24hrs), Avg:102.2 F (39 C), Min:102.2 F (39 C), Max:102.2 F (39 C)  Recent Labs  Lab 01/07/21 1516 01/07/21 1520  WBC 23.9*  --   CREATININE 1.82*  --   LATICACIDVEN  --  >11.0*    Estimated Creatinine Clearance: 76.6 mL/min (A) (by C-G formula based on SCr of 1.82 mg/dL (H)).    Not on File  Antimicrobials this admission: 6/2 vanc >> 6/2 acyclovir >> 6/2 ceftriaxone >>  Dose adjustments this admission: NA  Microbiology results: 6/2 BCx: pend 6/2 UCx: needs collection  6/2 LP: pend  Thank you for allowing pharmacy to be a part of this patient's care.  8/2, PharmD PGY1 Pharmacy Resident 01/07/2021 5:53 PM

## 2021-01-07 NOTE — ED Notes (Signed)
Admitting provider bedside 

## 2021-01-08 ENCOUNTER — Inpatient Hospital Stay (HOSPITAL_COMMUNITY): Payer: No Typology Code available for payment source

## 2021-01-08 ENCOUNTER — Encounter (HOSPITAL_COMMUNITY): Payer: Self-pay | Admitting: Family Medicine

## 2021-01-08 ENCOUNTER — Other Ambulatory Visit: Payer: Self-pay

## 2021-01-08 DIAGNOSIS — I472 Ventricular tachycardia: Secondary | ICD-10-CM | POA: Diagnosis not present

## 2021-01-08 DIAGNOSIS — R569 Unspecified convulsions: Secondary | ICD-10-CM

## 2021-01-08 LAB — BASIC METABOLIC PANEL
Anion gap: 8 (ref 5–15)
BUN: 9 mg/dL (ref 6–20)
CO2: 25 mmol/L (ref 22–32)
Calcium: 9.1 mg/dL (ref 8.9–10.3)
Chloride: 104 mmol/L (ref 98–111)
Creatinine, Ser: 1.15 mg/dL (ref 0.61–1.24)
GFR, Estimated: >60 mL/min (ref >60)
Glucose, Bld: 108 mg/dL — ABNORMAL HIGH (ref 70–99)
Potassium: 4.1 mmol/L (ref 3.5–5.1)
Sodium: 137 mmol/L (ref 135–145)

## 2021-01-08 LAB — CBC WITH DIFFERENTIAL/PLATELET
Abs Immature Granulocytes: 0.09 10*3/uL — ABNORMAL HIGH (ref 0.00–0.07)
Basophils Absolute: 0 10*3/uL (ref 0.0–0.1)
Basophils Relative: 0 %
Eosinophils Absolute: 0 10*3/uL (ref 0.0–0.5)
Eosinophils Relative: 0 %
HCT: 45.9 % (ref 39.0–52.0)
Hemoglobin: 14.6 g/dL (ref 13.0–17.0)
Immature Granulocytes: 1 %
Lymphocytes Relative: 3 %
Lymphs Abs: 0.4 10*3/uL — ABNORMAL LOW (ref 0.7–4.0)
MCH: 26.3 pg (ref 26.0–34.0)
MCHC: 31.8 g/dL (ref 30.0–36.0)
MCV: 82.6 fL (ref 80.0–100.0)
Monocytes Absolute: 0.3 10*3/uL (ref 0.1–1.0)
Monocytes Relative: 2 %
Neutro Abs: 13.6 10*3/uL — ABNORMAL HIGH (ref 1.7–7.7)
Neutrophils Relative %: 94 %
Platelets: 269 10*3/uL (ref 150–400)
RBC: 5.56 MIL/uL (ref 4.22–5.81)
RDW: 12.5 % (ref 11.5–15.5)
WBC: 14.4 10*3/uL — ABNORMAL HIGH (ref 4.0–10.5)
nRBC: 0 % (ref 0.0–0.2)

## 2021-01-08 LAB — ECHOCARDIOGRAM COMPLETE
Area-P 1/2: 4.41 cm2
Height: 72 in
S' Lateral: 3.1 cm
Weight: 3520 oz

## 2021-01-08 LAB — HEMOGLOBIN A1C
Hgb A1c MFr Bld: 5.5 % (ref 4.8–5.6)
Mean Plasma Glucose: 111 mg/dL

## 2021-01-08 LAB — CK
Total CK: 1478 U/L — ABNORMAL HIGH (ref 49–397)
Total CK: 1472 U/L — ABNORMAL HIGH (ref 49–397)

## 2021-01-08 MED ORDER — GADOBUTROL 1 MMOL/ML IV SOLN
9.9000 mL | Freq: Once | INTRAVENOUS | Status: AC | PRN
  Administered 2021-01-08: 9.9 mL via INTRAVENOUS

## 2021-01-08 MED ORDER — LEVETIRACETAM ER 500 MG PO TB24
1000.0000 mg | ORAL_TABLET | Freq: Every day | ORAL | Status: DC
  Administered 2021-01-08 – 2021-01-09 (×2): 1000 mg via ORAL
  Filled 2021-01-08 (×4): qty 2

## 2021-01-08 MED ORDER — LACTATED RINGERS IV SOLN: INTRAVENOUS | Status: AC

## 2021-01-08 NOTE — Progress Notes (Signed)
Family Medicine Teaching Service Daily Progress Note Intern Pager: [phone]  Patient name: George Valentine Medical record number: [id number] Date of birth: Dec 23, 1995 Age: 25 y.o. Gender: male  Primary Care Provider: Pcp, No Consultants: Neuro, PCCM Code Status: Full  Pt Overview and Major Events to Date:  01/07/2021: Admitted to FPTS  Assessment and Plan: George Valentine is a 25 y.o. male presented after a witnessed seizure. PMH is significant for MDD, Overactive Bladder s/p permanent InterStim (2019), febrile seizures.  Seizures Patient is at his baseline this a.m.  States only remember having a headache yesterday, asking for Tylenol.  Had 2 witnessed seizures yesterday and neurology is following.  CT head did not show any acute intracranial process.  Routine EEG shows cortical dysfunction in right temporal region which could be secondary to underlying structural abnormality or mild to moderate diffuse encephalopathy although no seizure or definite epileptiform discharges seen throughout the recording.  He has history of febrile seizure & another seizure at the age of 75.  CSF does not show any bacteria, discontinuing antibiotics today but will continue with acyclovir while waiting for HSV testing to come back.  We are thinking epilepsy versus heatstroke versus neurocysticercosis versus cardiomyopathy this CNS infection.  EEG is negative but would need MRI to rule out completely.  It can be heatstroke as patient was dehydrated, was out in the heat, elevated CK.  Ruling out neurocysticercosis as patient has not traveled outside the country and CT scan does not show any calcifications.  LP rules out bacterial CNS infection but still waiting for viral serology.  Also, EKG showed sinus tachycardia yesterday, we are obtaining echocardiogram to clarify more. --Neurology following, appreciate their recommendations --f/u MRI brain --c/w Acyclovir IV 1000 mg IV q8 --f/u HSV  --f/u CK levels --f/u  Echocardiogram -- Driving restrictions on discharge for 6 months. -- LR @150  ml/hr -- Seizures precautions -- PT and OT eval and treat  Sepsis ruled out Patient presented febrile, tachycardic and leukocytosis of 23.9.  He is afebrile, vital signs stable and WBCs are trending down nicely to 14.4.  Bacterial source of infection has been ruled out with blood cultures with no growth and also negative CSF gram stain, urine analysis within normal limits. --d/c antibiotics  Gastroenteritis Patient ate from buffet and had 4 episodes of watery stools for presenting to ED.  Complaining of mild abdominal discomfort but denies any watery stool. --Monitor closely --LR@150  ml/hr  Hx Overactive Bladder Patient has Permanent InterStim bladder [employer] device placed on 04/30/2018.   Informed family about obtaining the remote from home, fully charged him before he could get his MRI done and they have it with them in the hospital room.    -- Sees urologist yearly.  -- f/u HbA1c   FEN/GI: Regular diet PPx: Lovenox 40 mg   Status is: Inpatient  Remains inpatient appropriate because:Ongoing diagnostic testing needed not appropriate for outpatient work up   Dispo: The patient is from: Home              Anticipated d/c is to: Home              Patient currently is not medically stable to d/c.   Difficult to place patient No     Subjective:  No acute overnight events. Patient evaluated at bedside this morning with mother and sister present in the room.  He denies any headache this a.m. and states he is back to his baseline.  Denies any complaints except states  feels like he is about to have bowel movements.  Objective: Temp:  [98.3 F (36.8 C)-102.2 F (39 C)] 98.5 F (36.9 C) (06/03 0426) Pulse Rate:  [91-155] 91 (06/03 0426) Resp:  [17-30] 17 (06/03 0426) BP: (103-184)/(44-109) 116/44 (06/03 0426) SpO2:  [91 %-100 %] 100 % (06/03 0426) Weight:  [220 lb (99.8 kg)] 220 lb (99.8  kg) (06/02 1600) Physical Exam: General: Pleasent young male lying comfortably in bed Cardiovascular:RRR,  No murmur, rubs, gallops Respiratory: Clear to auscultation bilaterally Abdomen: Soft, nontender, nondistended Extremities: No edema appreciated Neuro: Alert, awake, oriented to time place and person.  Able to move all extremities well.  Laboratory: Recent Labs  Lab 01/07/21 1516 01/08/21 0214  WBC 23.9* 14.4*  HGB 15.5 14.6  HCT 53.1* 45.9  PLT 349 269   Recent Labs  Lab 01/07/21 1516 01/08/21 0214  NA 137 137  K 5.0 4.1  CL 100 104  CO2 15* 25  BUN 16 9  CREATININE 1.82* 1.15  CALCIUM 9.1 9.1  PROT 7.0  --   BILITOT 0.9  --   ALKPHOS 91  --   ALT 23  --   AST 35  --   GLUCOSE 223* 108*    Imaging/Diagnostic Tests: CT HEAD WO CONTRAST  Result Date: 01/07/2021 CLINICAL DATA:  Recent seizure activity with decreased responsiveness EXAM: CT HEAD WITHOUT CONTRAST TECHNIQUE: Contiguous axial images were obtained from the base of the skull through the vertex without intravenous contrast. COMPARISON:  None. FINDINGS: Brain: No evidence of acute infarction, hemorrhage, hydrocephalus, extra-axial collection or mass lesion/mass effect. Vascular: No hyperdense vessel or unexpected calcification. Skull: Normal. Negative for fracture or focal lesion. Sinuses/Orbits: No acute finding. Other: None. IMPRESSION: No acute intracranial abnormality noted. Electronically Signed   By: Alcide Clever M.D.   On: 01/07/2021 16:52   DG Chest Port 1 View  Result Date: 01/07/2021 CLINICAL DATA:  Seizure.  Unresponsive. EXAM: PORTABLE CHEST 1 VIEW COMPARISON:  None. FINDINGS: The heart size and mediastinal contours are within normal limits. Both lungs are clear. The visualized skeletal structures are unremarkable. IMPRESSION: No active disease. Electronically Signed   By: Obie Dredge M.D.   On: 01/07/2021 15:30    Alexandrea Westergard, Geralynn Rile, MD 01/08/2021, 7:07 AM PGY-1, [facility] FPTS  Intern pager: [phone], text pages welcome

## 2021-01-08 NOTE — Progress Notes (Signed)
Verified w/ [employer], pt has MR conditional Interstim device. Needs remote FULLY CHARGED in order to program implant for safety before MRI. Remote programming needs to be witnessed by Technologist in MRI before scan.

## 2021-01-08 NOTE — Progress Notes (Signed)
  Echocardiogram 2D Echocardiogram has been performed.  Delcie Roch 01/08/2021, 2:50 PM

## 2021-01-08 NOTE — Evaluation (Signed)
Physical Therapy Evaluation Patient Details Name: Corinthian Mizrahi MRN: [id number] DOB: 1996-01-20 Today's Date: 01/08/2021   History of Present Illness  25 y.o. male admitted 6/2 with seixure. PMH is significant for MDD, Overactive Bladder, and Flexural Exema.    Clinical Impression  PT eval complete. Pt is independent with all functional mobility. Strength and balance intact. No further PT intervention indicated. PT signing off.     Follow Up Recommendations No PT follow up    Equipment Recommendations  None recommended by PT    Recommendations for Other Services       Precautions / Restrictions Precautions Precautions: Other (comment) Precaution Comments: seizure      Mobility  Bed Mobility Overal bed mobility: Independent                  Transfers Overall transfer level: Independent                  Ambulation/Gait Ambulation/Gait assistance: Independent Gait Distance (Feet): 500 Feet Assistive device: None Gait Pattern/deviations: WFL(Within Functional Limits)   Gait velocity interpretation: >4.37 ft/sec, indicative of normal walking speed    Stairs            Wheelchair Mobility    Modified Rankin (Stroke Patients Only)       Balance Overall balance assessment: Independent                                           Pertinent Vitals/Pain Pain Assessment: No/denies pain    Home Living Family/patient expects to be discharged to:: Private residence Living Arrangements: Parent Available Help at Discharge: Family;Available 24 hours/day Type of Home: House Home Access: Level entry     Home Layout: One level Home Equipment: None      Prior Function Level of Independence: Independent         Comments: recent college graduate. Scheduled to start a new job on Monday, 6/6.     Hand Dominance        Extremity/Trunk Assessment   Upper Extremity Assessment Upper Extremity Assessment: Overall WFL for tasks  assessed    Lower Extremity Assessment Lower Extremity Assessment: Overall WFL for tasks assessed    Cervical / Trunk Assessment Cervical / Trunk Assessment: Normal  Communication   Communication: No difficulties  Cognition Arousal/Alertness: Awake/alert Behavior During Therapy: WFL for tasks assessed/performed (mildly flat) Overall Cognitive Status: Within Functional Limits for tasks assessed                                        General Comments General comments (skin integrity, edema, etc.): VSS. No c/o pain or dizziness.    Exercises     Assessment/Plan    PT Assessment Patent does not need any further PT services  PT Problem List         PT Treatment Interventions      PT Goals (Current goals can be found in the Care Plan section)  Acute Rehab PT Goals Patient Stated Goal: home PT Goal Formulation: All assessment and education complete, DC therapy    Frequency     Barriers to discharge        Co-evaluation               AM-PAC PT "6 Clicks" Mobility  Outcome  Measure Help needed turning from your back to your side while in a flat bed without using bedrails?: None Help needed moving from lying on your back to sitting on the side of a flat bed without using bedrails?: None Help needed moving to and from a bed to a chair (including a wheelchair)?: None Help needed standing up from a chair using your arms (e.g., wheelchair or bedside chair)?: None Help needed to walk in hospital room?: None Help needed climbing 3-5 steps with a railing? : None 6 Click Score: 24    End of Session   Activity Tolerance: Patient tolerated treatment well Patient left: in bed;with call bell/phone within reach;with family/visitor present Nurse Communication: Mobility status PT Visit Diagnosis: Difficulty in walking, not elsewhere classified (R26.2)    Time: 1610-9604 PT Time Calculation (min) (ACUTE ONLY): 17 min   Charges:   PT Evaluation $PT Eval  Low Complexity: 1 Low          Aida Raider, PT  Office # [phone] Pager [phone]   Ilda Foil 01/08/2021, 10:29 AM

## 2021-01-08 NOTE — Procedures (Signed)
Patient Name: George Valentine  MRN: [id number]  Epilepsy Attending: Charlsie Quest  Referring Physician/Provider: Dr Brooke Dare Date: 01/07/2021 Duration: 24.32 mins  Patient history: 25 year old male presented with breakthrough seizure.  EEG to evaluate for seizures.  Level of alertness: Asleep  AEDs during EEG study: None  Technical aspects: This EEG study was done with scalp electrodes positioned according to the 10-20 International system of electrode placement. Electrical activity was acquired at a sampling rate of 500Hz  and reviewed with a high frequency filter of 70Hz  and a low frequency filter of 1Hz . EEG data were recorded continuously and digitally stored.   Description: Sleep was characterized by vertex waves, sleep spindles (12 to 14 Hz), maximal frontocentral region.  EEG showed continuous generalized maximal right temporal region 3 to 5 Hz theta-delta slowing. Hyperventilation and photic stimulation were not performed.     ABNORMALITY -  continuous slow, generalized and maximal right temporal region  IMPRESSION: This study during sleep only is suggestive of cortical dysfunction in right temporal region which could be secondary to underlying structural abnormality as well as mild to moderate moderate diffuse encephalopathy, nonspecific etiology.  No seizures or definite epileptiform discharges were seen throughout the recording.  If suspicion for ictal-interictal activity remains a concern, a prolonged study should be considered.   [name] 

## 2021-01-08 NOTE — Progress Notes (Signed)
Encephalopathy due to postictal state

## 2021-01-08 NOTE — Progress Notes (Addendum)
Curbside with Infectious Disease.  Indicated IV Acyclovir treatment duration can be determined on clinical suspicion, but indicated most often will continue IV Acyclovir until HSV cultures return.   - Plan to continue IV Acyclovir for time being  George Kussmaul, MD 01/08/2021, 10:43 PM PGY-1, [facility] Family Medicine Service pager [phone]

## 2021-01-08 NOTE — Evaluation (Signed)
Occupational Therapy Evaluation Patient Details Name: George Valentine MRN: [id number] DOB: 1996-03-22 Today's Date: 01/08/2021    History of Present Illness 25 y.o. male admitted 6/2 with seixure. PMH is significant for MDD, Overactive Bladder, and Flexural Exema.   Clinical Impression   PTA, pt was living with his mother after recent college graduation and was independent; planning to start job in [place] this Monday. Pt currently performing ADLs at Supervision level and mobility at Mod I - independent. Pt presenting with slight deficits in executive functioning. Discussing cognitive deficits with patient including compensatory techniques and symptoms; pt verbalized understanding. Recommend dc to home once medically stable per physician. Will continue to follow acutely to further assess cognition.    Follow Up Recommendations  No OT follow up (Providing education on getting op OT/SLP if he notices difficulties with cognition)    Equipment Recommendations  None recommended by OT    Recommendations for Other Services       Precautions / Restrictions Precautions Precautions: Other (comment) Precaution Comments: seizure      Mobility Bed Mobility Overal bed mobility: Independent                  Transfers Overall transfer level: Independent                    Balance Overall balance assessment: Independent                                         ADL either performed or assessed with clinical judgement   ADL Overall ADL's : Needs assistance/impaired                                       General ADL Comments: Pt performing at Supervision level. Noting that pt has decreased executive functioning and awareness. During oral care, pt placing tooth paste on tooth brush, wiping tooth paste in mouth, and then placing more tooth paste - during this, toothpaste dropping from pt's mouth.     Vision Baseline Vision/History: Wears  glasses Patient Visual Report: No change from baseline       Perception     Praxis      Pertinent Vitals/Pain Pain Assessment: No/denies pain     Hand Dominance Right   Extremity/Trunk Assessment Upper Extremity Assessment Upper Extremity Assessment: Overall WFL for tasks assessed   Lower Extremity Assessment Lower Extremity Assessment: Overall WFL for tasks assessed   Cervical / Trunk Assessment Cervical / Trunk Assessment: Normal   Communication Communication Communication: No difficulties   Cognition Arousal/Alertness: Awake/alert Behavior During Therapy: WFL for tasks assessed/performed (mildly flat) Overall Cognitive Status: Within Functional Limits for tasks assessed Area of Impairment: Awareness                           Awareness: Emergent;Anticipatory   General Comments: Pt presenting with decreased executive functioning and awareness. Unsure if this is baseline cognition. During questions about cognition, pt with difficulty answering question directly.   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        OT Problem List: Decreased activity tolerance;Impaired balance (sitting and/or standing);Decreased knowledge of precautions      OT Treatment/Interventions: Self-care/ADL training;Therapeutic exercise;Energy conservation;DME and/or AE instruction;Therapeutic activities;Patient/family education     OT Goals(Current goals can be found in the care plan section) Acute Rehab OT Goals Patient Stated Goal: Go home OT Goal Formulation: With patient Time For Goal Achievement: 01/22/21 Potential to Achieve Goals: Good  OT Frequency: Min 2X/week   Barriers to D/C:            Co-evaluation              AM-PAC OT "6 Clicks" Daily Activity     Outcome Measure Help from another person eating meals?: None Help from another person taking care of personal grooming?: None Help from another person toileting, which includes using toliet, bedpan, or urinal?: A Little Help from another person bathing (including washing, rinsing, drying)?: A Little Help from another person to put on and taking off regular upper body clothing?: None Help from another person to put on and taking off regular lower body clothing?: A Little 6 Click Score: 21   End of Session Nurse Communication: Mobility status  Activity Tolerance: Patient tolerated treatment well Patient left: in bed;with call bell/phone within reach;with family/visitor present  OT Visit Diagnosis: Other abnormalities of gait and mobility (R26.89);Muscle weakness (generalized) (M62.81);Other symptoms and signs involving cognitive function                Time: 1020-1035 OT Time Calculation (min): 15 min Charges:  OT General Charges $OT Visit: 1 Visit OT Evaluation $OT Eval Low Complexity: 1 Low  George Valentine MSOT, OTR/L Acute Rehab Pager: [phone] Office: [phone]  George Valentine George Valentine 01/08/2021, 1:28 PM

## 2021-01-08 NOTE — Progress Notes (Signed)
NEUROLOGY PROGRESS NOTE  Patient ID: George Valentine is a 25 year old male w/pmh of OAB, Depression, Difficulty concentrating who presents with two seizures for which neurology was consulted.    Subjective: - No acute events overnight, he is doing better today sitting up in bed with mother and sister at bedside upon entering the room  - Questions answered regarding seizures and remaining work up  - Pending MRI Brain- needed to make sure it was compatible with his InterSim device that was placed in 2019    Current Facility-Administered Medications:  .  acyclovir (ZOVIRAX) 1,000 mg in dextrose 5 % 150 mL IVPB, 10 mg/kg, Intravenous, Q8H, Kyung Rudd, [facility], Last Rate: 170 mL/hr at 01/08/21 2044, 1,000 mg at 01/08/21 2044 .  enoxaparin (LOVENOX) injection 40 mg, 40 mg, Subcutaneous, Q24H, Maness, Philip, MD, 40 mg at 01/08/21 2044 .  lactated ringers infusion, , Intravenous, Continuous, Lilland, Alana, DO, Last Rate: 150 mL/hr at 01/08/21 2043, New Bag at 01/08/21 2043 .  levETIRAcetam (KEPPRA XR) 24 hr tablet 1,000 mg, 1,000 mg, Oral, QHS, Jahleel Stroschein L, MD   Physical Exam  Constitutional: Appears well-developed and well-nourished.  Psych: Affect appropriate to situation Cardiovascular: Normal rate and regular rhythm.  Respiratory: Effort normal, non-labored breathing  Neuro:  Mental Status: AAOx4, following commands Cranial Nerves: EOMI, VFF, Face symmetric, Tongue midline, Shoulder shrug intact  Motor: Normal bulk and tone. No drift to any extremity. Strength is 5/5 throughout  Sensory: Sensation intact to light touch throughout  Deep Tendon Reflexes: 2+ and symmetric throughout Plantars: Mute  Cerebellar: FNF intact   Pertinent Labs and Imaging:  01/07/21 then 01/08/21 CBC pertinent for white count at 23.9-> 14.4  01/07/21 then 01/08/21 CMP with elevated Cr 1.82-> 1.15  01/07/21 Lactic Acid elevated > 11  01/07/21 Ethanol undetectable 01/07/21 UDS positive for benzodiazepines ( post  receiving Versed + Ativan)  01/07/21 Urinalysis non compelling for UTI  01/08/21 CK elevated 1478 --> 1472  01/07/21 CSF: RBC 22/1, WBC 1/1, clear fluid Protein 23, Glucose 62, Culture w/no organisms seen + WBC   CT Head 01/07/21 personally reviewed by attending physician -No acute intracranial process  EEG Routine 01/07/21  Cortical dysfunction in the right temporal region which could be secondary to underlying structural abnormality as well as mild to moderate diffuse encephalopathy, non specific etiology. No seizures or definite epileptiform discharges seen throughout the recording.   MRI Brain W WO 01/08/21 MRI brain personally reviewed by attending physician, agree with radiology read that right hippocampus restricted diffusion is in a pattern consistent with post-seizure edema; otherwise normal within limits of motion degradation   Assessment:  George Valentine is a 25 year old male w/pmh of overactive bladder, Depression, Difficulty Concentrating, Febrile Seizures who presents with two seizures. Physical examination yesterday he was obtunded examination has significantly improved today and is non focal. He has not fevered since 6/2 and white count has come down today. Bacterial source of infection has been ruled out with work up so far; blood cultures with no growth, negative CSF gram stain, UA WNL. Have continued antiviral therapy with acyclovir given HSV CSF study is still pending.   Spoke further with patient today and he states that when he had the syncopal episode at age 33 he was reported to have "shaking" by witnesses. Also states he was noted to have a fever when he went to the nurses office after the event.   EEG was pertinent for cortical dysfunction in the right temporal  region which could be due to underlying structural abnormality vs encephalopathy. 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 May stop IVF from rhabdo perspective 6/4 AM - Given seizure he is unable to drive for 6 months, full seizure precautions as below - Keppra 1000 mg XR nightly for seizure ppx   - Neurology will continue to follow   Standard seizure precautions: Per [facility] statutes, patients with seizures are not allowed to drive until  they have been seizure-free for six months. Use caution when using heavy equipment or power tools. Avoid working on ladders or at heights. Take showers instead of baths. Ensure the water temperature is not too high on the home water heater. Do not go swimming alone. When caring for infants or small children, sit down when holding, feeding, or changing them to minimize risk of injury to the child in the event you have a seizure.  To reduce risk of seizures, maintain good sleep hygiene avoid alcohol and illicit drug use, take all anti-seizure medications as prescribed.    Stark Jock, NP  [facility] Nurse Practitioner  01/08/2021, 12:31 PM   Patient seen and discussed with attending physician Dr. Iver Nestle  Attending Neurologist's note:  I personally saw this patient, gathering history, performing a neurologic examination, reviewing relevant labs, personally reviewing relevant imaging including MRI brain and formulated the assessment and plan, adding the note above for completeness and clarity to accurately reflect my thoughts

## 2021-01-09 DIAGNOSIS — G40901 Epilepsy, unspecified, not intractable, with status epilepticus: Secondary | ICD-10-CM | POA: Diagnosis not present

## 2021-01-09 DIAGNOSIS — R569 Unspecified convulsions: Secondary | ICD-10-CM | POA: Diagnosis not present

## 2021-01-09 DIAGNOSIS — R509 Fever, unspecified: Secondary | ICD-10-CM | POA: Diagnosis not present

## 2021-01-09 LAB — BASIC METABOLIC PANEL
Anion gap: 6 (ref 5–15)
BUN: 8 mg/dL (ref 6–20)
CO2: 24 mmol/L (ref 22–32)
Calcium: 8.5 mg/dL — ABNORMAL LOW (ref 8.9–10.3)
Chloride: 109 mmol/L (ref 98–111)
Creatinine, Ser: 1.01 mg/dL (ref 0.61–1.24)
GFR, Estimated: >60 mL/min (ref >60)
Glucose, Bld: 100 mg/dL — ABNORMAL HIGH (ref 70–99)
Potassium: 3.5 mmol/L (ref 3.5–5.1)
Sodium: 139 mmol/L (ref 135–145)

## 2021-01-09 LAB — CBC WITH DIFFERENTIAL/PLATELET
Abs Immature Granulocytes: 0.03 10*3/uL (ref 0.00–0.07)
Basophils Absolute: 0 10*3/uL (ref 0.0–0.1)
Basophils Relative: 0 %
Eosinophils Absolute: 0 10*3/uL (ref 0.0–0.5)
Eosinophils Relative: 0 %
HCT: 40.5 % (ref 39.0–52.0)
Hemoglobin: 13.2 g/dL (ref 13.0–17.0)
Immature Granulocytes: 0 %
Lymphocytes Relative: 14 %
Lymphs Abs: 1.1 10*3/uL (ref 0.7–4.0)
MCH: 26.7 pg (ref 26.0–34.0)
MCHC: 32.6 g/dL (ref 30.0–36.0)
MCV: 81.8 fL (ref 80.0–100.0)
Monocytes Absolute: 0.7 10*3/uL (ref 0.1–1.0)
Monocytes Relative: 9 %
Neutro Abs: 5.8 10*3/uL (ref 1.7–7.7)
Neutrophils Relative %: 77 %
Platelets: 225 10*3/uL (ref 150–400)
RBC: 4.95 MIL/uL (ref 4.22–5.81)
RDW: 12.8 % (ref 11.5–15.5)
WBC: 7.6 10*3/uL (ref 4.0–10.5)
nRBC: 0 % (ref 0.0–0.2)

## 2021-01-09 LAB — HSV 1/2 PCR, CSF
HSV-1 DNA: NEGATIVE
HSV-2 DNA: NEGATIVE

## 2021-01-09 NOTE — Progress Notes (Signed)
FPTS Interim Progress Note  Patient sleeping and resting comfortably.  Rounded with primary RN. No seizure activity. No concerns voiced.  No orders required.  Appreciated nightly round.  Today's Vitals   01/08/21 2002 01/08/21 2300 01/08/21 2342 01/09/21 0427  BP: (!) 122/45  (!) 141/71 (!) 127/59  Pulse: (!) 110  (!) 104 85  Resp: 19  18 18   Temp: 98.6 F (37 C)  99.1 F (37.3 C) 98.3 F (36.8 C)  TempSrc: Oral  Oral Oral  SpO2: 97%  98% 98%  Weight:      Height:      PainSc:  0-No pain     MRI showed restricted diffusion on right hippocampus likely indicating postictal phenomenon.  No brain lesion.  CSF culture shows no growth x 24 hrs Blood Cultures no growth x 24 hrs  Neuro following, appreciate recommendations. Continue Keppra XR 1000 mg qhs   ID curbsided--> continue IV Acyclovir until CSF HSV1/2 results.  Continue IV Hydration while receiving antiviral therapy   , MD Family Medicine Residency

## 2021-01-09 NOTE — Plan of Care (Signed)

## 2021-01-09 NOTE — Progress Notes (Signed)
Family Medicine Teaching Service Daily Progress Note Intern Pager: [phone]  Patient name: George Valentine Medical record number: [id number] Date of birth: 1996/07/04 Age: 25 y.o. Gender: male  Primary Care Provider: Pcp, No Consultants: Neuro Code Status: Full  Pt Overview and Major Events to Date:  01/07/2021: Admitted to FPTS  Assessment and Plan: George Valentine is a 25 y.o. male presented after a witnessed seizure. PMH is significant for MDD, Overactive Bladder s/p permanent InterStim (2019), febrile seizures.  Seizure Like Activity Alert and Oriented MRI showed restricted diffusion in the right hippocampus, most likely indicating postictal phenomenon.  Curbside with Infectious Disease.  Indicated IV Acyclovir treatment duration can be determined on clinical suspicion, but indicated most often will continue IV Acyclovir until HSV cultures return. - Neurology following, appreciate recs - Follow up HSV results  - Continue IV Acyclovir - Keppra 1000 mg XR nightly for seizure ppx    Overactive bladder A1C- 5.5.  Has implanted device.  Seen by Urology yearly.  FEN/GI: Regular diet PPx: Lovenox  Status is: Inpatient  Remains inpatient appropriate because:IV treatments appropriate due to intensity of illness or inability to take PO   Dispo: The patient is from: Home              Anticipated d/c is to: Home              Patient currently is not medically stable to d/c.   Difficult to place patient No   Subjective:  Headaches resolved.  Endorses still having some diarrhea.    Objective: Temp:  [98.3 F (36.8 C)-99.1 F (37.3 C)] 98.3 F (36.8 C) (06/04 0427) Pulse Rate:  [85-110] 85 (06/04 0427) Resp:  [16-19] 18 (06/04 0427) BP: (107-141)/(44-71) 127/59 (06/04 0427) SpO2:  [95 %-100 %] 98 % (06/04 0427)  Physical Exam Constitutional:      Appearance: Normal appearance.  HENT:     Head: Normocephalic and atraumatic.     Mouth/Throat:     Mouth: Mucous membranes are moist.   Cardiovascular:     Rate and Rhythm: Normal rate and regular rhythm.     Pulses: Normal pulses.  Pulmonary:     Effort: Pulmonary effort is normal.     Breath sounds: Normal breath sounds.  Skin:    General: Skin is warm.  Neurological:     General: No focal deficit present.     Mental Status: He is alert and oriented to person, place, and time. Mental status is at baseline.     Motor: No weakness.     Laboratory: Recent Labs  Lab 01/07/21 1516 01/08/21 0214 01/09/21 0319  WBC 23.9* 14.4* 7.6  HGB 15.5 14.6 13.2  HCT 53.1* 45.9 40.5  PLT 349 269 225   Recent Labs  Lab 01/07/21 1516 01/08/21 0214 01/09/21 0319  NA 137 137 139  K 5.0 4.1 3.5  CL 100 104 109  CO2 15* 25 24  BUN 16 9 8   CREATININE 1.82* 1.15 1.01  CALCIUM 9.1 9.1 8.5*  PROT 7.0  --   --   BILITOT 0.9  --   --   ALKPHOS 91  --   --   ALT 23  --   --   AST 35  --   --   GLUCOSE 223* 108* 100*    Imaging/Diagnostic Tests:  EXAM: MRI HEAD WITHOUT AND WITH CONTRAST  TECHNIQUE: Multiplanar, multiecho pulse sequences of the brain and surrounding structures were obtained without and with intravenous contrast.  CONTRAST:  9.69mL GADAVIST GADOBUTROL 1 MMOL/ML IV SOLN  COMPARISON:  Head CT yesterday.  FINDINGS: Brain: Restricted diffusion present within the hippocampus on the right, likely postictal phenomenon. The study is considerably degraded by patient motion. The remainder the brain has a normal appearance. No evidence of malformation. No pre-existing mesial temporal abnormality discernible. After contrast administration, no abnormal enhancement occurs.  Vascular: Major vessels at the base of the brain show flow.  Skull and upper cervical spine: Negative  Sinuses/Orbits: Clear/normal  Other: None  IMPRESSION: Motion degraded exam. Restricted diffusion in the right hippocampus, most likely indicating postictal phenomenon. No discernible pre-existing brain lesion.  Jovita Kussmaul, MD 01/09/2021, 6:27 AM PGY-1, [facility] Family Medicine FPTS Intern pager: [phone], text pages welcome

## 2021-01-09 NOTE — Progress Notes (Signed)
NEUROLOGY PROGRESS NOTE  Patient ID: George Valentine is a 25 year old male w/pmh of OAB, Depression, Difficulty concentrating who presents with two seizures for which neurology was consulted.    Subjective: - No acute events overnight, he is doing better today sitting up in bed with mother and sister at bedside upon entering the room  - No headache, no positional headache either - No fevers - Denies any history of HSV lesions - Patient denies reducibly before this event stating that he always only sleeps 6 hours per night -Tolerating Keppra well, reviewed potential side effects of irritability with family   Current Facility-Administered Medications:  .  enoxaparin (LOVENOX) injection 40 mg, 40 mg, Subcutaneous, Q24H, Maness, Philip, MD, 40 mg at 01/08/21 2044 .  levETIRAcetam (KEPPRA XR) 24 hr tablet 1,000 mg, 1,000 mg, Oral, QHS, Narely Nobles L, MD, 1,000 mg at 01/08/21 2220   Physical Exam  Constitutional: Appears well-developed and well-nourished.  Psych: Affect appropriate to situation, slightly flat Cardiovascular: Normal rate and regular rhythm.  Respiratory: Effort normal, non-labored breathing Abdomen: non-tender, non-distended  Neuro:  Mental Status: AAOx4, following commands Cranial Nerves: EOMI, VFF, Face symmetric, Tongue midline  Motor: Normal bulk and tone. Moving all extremities equally and freely   Pertinent Labs and Imaging:  01/07/21 then 01/08/21 CBC pertinent for white count at 23.9-> 14.4  01/07/21 then 01/08/21 CMP with elevated Cr 1.82-> 1.15  01/07/21 Lactic Acid elevated > 11  01/07/21 Ethanol undetectable 01/07/21 UDS positive for benzodiazepines ( post receiving Versed + Ativan)  01/07/21 Urinalysis non compelling for UTI  01/08/21 CK elevated 1478 --> 1472  01/07/21 CSF: RBC 22/1, WBC 1/1, clear fluid Protein 23, Glucose 62, Culture w/no organisms seen + WBC   CT Head 01/07/21 personally reviewed by attending physician -No acute intracranial process  EEG Routine  01/07/21  Cortical dysfunction in the right temporal region which could be secondary to underlying structural abnormality as well as mild to moderate diffuse encephalopathy, non specific etiology. No seizures or definite epileptiform discharges seen throughout the recording.   MRI Brain W WO 01/08/21 MRI brain personally reviewed by attending physician, agree with radiology read that right hippocampus restricted diffusion is in a pattern consistent with post-seizure edema; otherwise normal within limits of motion degradation    Personally reviewed with family today  Assessment:  George Valentine is a 25 year old male w/pmh of overactive bladder, Depression, Difficulty Concentrating, Febrile Seizures who presented with two seizures, now improving with no further events.  Discussed with [facility], HSV will not result until Monday.  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  If he remains stable with no fevers or further events, could consider discharge 6/5 with return precautions for any further events - Can stop IV fluids, no longer concern for rhabdo - Given seizure he is legally not allowed to drive for 6 months, full seizure precautions as below reviewed with patient and family - Keppra 1000  mg XR nightly for seizure ppx   -Outpatient follow-up with [facility], order placed -Neurology will be available  on an as-needed basis going forward, please reach out if new questions or concerns arise  Standard seizure precautions: Per [facility] statutes, patients with seizures are not allowed to drive until  they have been seizure-free for six months. Use caution when using heavy equipment or power tools. Avoid working on ladders or at heights. Take showers instead of baths. Ensure the water temperature is not too high on the home water heater. Do not go swimming alone. When caring for infants or small children, sit down when holding, feeding, or changing them to minimize risk of injury to the child in the event you have a seizure.  To reduce risk of seizures, maintain good sleep hygiene avoid alcohol and illicit drug use, take all anti-seizure medications as prescribed.   Brooke Dare MD-PhD [facility] [phone] Available 7 AM to 7 PM, outside these hours please contact Neurologist on call listed on AMION   Greater than 35 minutes were spent in direct care of this patient today, more than 50% at bedside including reviewing imaging, seizure precautions, medication side effects and importance of adherence

## 2021-01-10 DIAGNOSIS — R509 Fever, unspecified: Secondary | ICD-10-CM | POA: Diagnosis not present

## 2021-01-10 DIAGNOSIS — G40901 Epilepsy, unspecified, not intractable, with status epilepticus: Secondary | ICD-10-CM | POA: Diagnosis not present

## 2021-01-10 DIAGNOSIS — R569 Unspecified convulsions: Secondary | ICD-10-CM | POA: Diagnosis not present

## 2021-01-10 DIAGNOSIS — G40109 Localization-related (focal) (partial) symptomatic epilepsy and epileptic syndromes with simple partial seizures, not intractable, without status epilepticus: Secondary | ICD-10-CM

## 2021-01-10 LAB — URINE CULTURE: Culture: NO GROWTH

## 2021-01-10 LAB — CSF CULTURE W GRAM STAIN: Culture: NO GROWTH

## 2021-01-10 MED ORDER — LEVETIRACETAM ER 1000 MG PO TB24: 1000.0000 mg | ORAL_TABLET | Freq: Every day | ORAL | 0 refills | Status: DC

## 2021-01-10 NOTE — Progress Notes (Signed)
Nsg Discharge Note  Admit Date:  01/07/2021 Discharge date: 01/10/2021   R[place] Early to be D/C'd  per MD order.  AVS completed.   Patient/caregiver able to verbalize understanding.  Discharge Medication: Allergies as of 01/10/2021   No Known Allergies     Medication List    TAKE these medications   levETIRAcetam ER 1000 MG Tb24 Take 1,000 mg by mouth at bedtime.   mometasone 0.1 % cream Commonly known as: ELOCON Apply 1 application topically See admin instructions. Apply to affected area two times a day for no more than 2 weeks       Discharge Assessment: Vitals:   01/09/21 2339 01/10/21 0350  BP: 140/63 (!) 128/57  Pulse: 79 63  Resp: 18 17  Temp: 98.2 F (36.8 C) 98 F (36.7 C)  SpO2: 97% 97%   Skin clean, dry and intact without evidence of skin break down, no evidence of skin tears noted. IV catheter discontinued intact. Site without signs and symptoms of complications - no redness or edema noted at insertion site, patient denies c/o pain - only slight tenderness at site.  Dressing with slight pressure applied.  D/c Instructions-Education: Discharge instructions given to patient/family with verbalized understanding. D/c education completed with patient/family including follow up instructions, medication list, d/c activities limitations if indicated, with other d/c instructions as indicated by MD - patient able to verbalize understanding, all questions fully answered. Patient instructed to return to ED, call 911, or call MD for any changes in condition.  Patient escorted via WC, and D/C home via private auto.  Tona Qualley, Tilford Pillar, RN 01/10/2021 12:45 PM

## 2021-01-10 NOTE — Progress Notes (Signed)
Patient states that he had ear phones and gym clothes when he passed out. Same not in the room. Cell phone in the room and returned to patient. Called ED. Not found.

## 2021-01-10 NOTE — Hospital Course (Addendum)
George Valentine is a 25 year old male with PMH of overactive bladder treated with implantation of permanent InterSim in 2019 and depression who presented with seizure witnessed by EMS. He was at the [facility] gym and went to ask for some Tylenol because he was not feeling well. After asking for Tylenol he became unresponsive thus EMS was called. When EMS arrived he was seizing and given Versed. He had repeat seizure-like episodes in the ED and received Ativan, Dexamethasone, and Keppra. Of note he was tachycardia and febrile. Lumbar puncture obtained. Neurology evaluated patient. Patient started on empiric antibiotics and acyclovir. EEG did not show seizure activity.  Meningitis including HSV work-up was negative.  As patient had diarrhea, fever and leukocytosis, his seizure threshold was lowered in the setting of an infectious process.  Blood and urine cultures were unremarkable.  MRI brain indicated a post-seizure edema.  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  He was started on Keppra 1000 mg XR nightly for seizure prophylaxis.  He is to follow-up with [facility].  Patient given seizure precautions and advised by several providers not to drive for the next 6 months.  He voiced understanding of this requirement.

## 2021-01-10 NOTE — Care Plan (Signed)
HSV has resulted negative, patient cleared for outpatient follow-up from a neurological perspective.   Brooke Dare MD-PhD [facility] [phone] Available 7 AM to 7 PM, outside these hours please contact Neurologist on call listed on AMION

## 2021-01-10 NOTE — Discharge Instructions (Signed)
Thank you for letting us care for you during your stay.  You were admitted to the [facility] Teaching [facility].   You were admitted for seizure activity.  We found evidence of seizure activity on your brain MRI during your stay. Neurology started you on anti-seizure medications that your should continue taking at night. We recommend follow with neurology. See follow up provider section for details.   Per [facility] statutes, patients with seizures are not allowed to drive until they have been seizure-free for six months. Do not drive for the next 6 months. Use caution when using heavy equipment or power tools. Avoid working on ladders or at heights. Take showers instead of baths. Ensure the water temperature is not too high on the home water heater. Do not go swimming alone. When caring for infants or small children, sit down when holding, feeding, or changing them to minimize risk of injury to the child in the event you have a seizure.  To reduce risk of seizures, maintain good sleep hygiene avoid alcohol and illicit drug use, take all anti-seizure medications as prescribed.    Please follow up with your primary care physician in 1 week.   If your symptoms worsen or return, please return to the hospital.  Please let us know if you have questions about your stay at [facility]      Seizure, Adult A seizure is a sudden burst of abnormal electrical and chemical activity in the brain. Seizures usually last from 30 seconds to 2 minutes.  What are the causes? Common causes of this condition include:  Fever or infection.  Problems that affect the brain. These may include: ? A brain or head injury. ? Bleeding in the brain. ? A brain tumor.  Low levels of blood sugar or salt.  Kidney problems or liver problems.  Conditions that are passed from parent to child (are inherited).  Problems with a substance, such as: ? Having a reaction to a drug or a  medicine. ? Stopping the use of a substance all of a sudden (withdrawal).  A stroke.  Disorders that affect how you develop. Sometimes, the cause may not be known.  What increases the risk?  Having someone in your family who has epilepsy. In this condition, seizures happen again and again over time. They have no clear cause.  Having had a tonic-clonic seizure before. This type of seizure causes you to: ? Tighten the muscles of the whole body. ? Lose consciousness.  Having had a head injury or strokes before.  Having had a lack of oxygen at birth. What are the signs or symptoms? There are many types of seizures. The symptoms vary depending on the type of seizure you have. Symptoms during a seizure  Shaking that you cannot control (convulsions) with fast, jerky movements of muscles.  Stiffness of the body.  Breathing problems.  Feeling mixed up (confused).  Staring or not responding to sound or touch.  Head nodding.  Eyes that blink, flutter, or move fast.  Drooling, grunting, or making clicking sounds with your mouth  Losing control of when you pee or poop. Symptoms before a seizure  Feeling afraid, nervous, or worried.  Feeling like you may vomit.  Feeling like: ? You are moving when you are not. ? Things around you are moving when they are not.  Feeling like you saw or heard something before (dj vu).  Odd tastes or smells.  Changes in how you see. You may  see flashing lights or spots. Symptoms after a seizure  Feeling confused.  Feeling sleepy.  Headache.  Sore muscles. How is this treated? If your seizure stops on its own, you will not need treatment. If your seizure lasts longer than 5 minutes, you will normally need treatment. Treatment may include:  Medicines given through an IV tube.  Avoiding things, such as medicines, that are known to cause your seizures.  Medicines to prevent seizures.  A device to prevent or control  seizures.  Surgery.  A diet low in carbohydrates and high in fat (ketogenic diet). Follow these instructions at home: Medicines  Take over-the-counter and prescription medicines only as told by your doctor.  Avoid foods or drinks that may keep your medicine from working, such as alcohol. Activity  Follow instructions about driving, swimming, or doing things that would be dangerous if you had another seizure. Wait until your doctor says it is safe for you to do these things.  If you live in the [place] ask your local department of motor vehicles when you can drive.  Get a lot of rest. Teaching others  Teach friends and family what to do when you have a seizure. They should: ? Help you get down to the ground. ? Protect your head and body. ? Loosen any clothing around your neck. ? Turn you on your side. ? Know whether or not you need emergency care. ? Stay with you until you are better.  Also, tell them what not to do if you have a seizure. Tell them: ? They should not hold you down. ? They should not put anything in your mouth.   General instructions  Avoid anything that gives you seizures.  Keep a seizure diary. Write down: ? What you remember about each seizure. ? What you think caused each seizure.  Keep all follow-up visits. Contact a doctor if:  You have another seizure or seizures. Call the doctor each time you have a seizure.  The pattern of your seizures changes.  You keep having seizures with treatment.  You have symptoms of being sick or having an infection.  You are not able to take your medicine. Get help right away if:  You have any of these problems: ? A seizure that lasts longer than 5 minutes. ? Many seizures in a row and you do not feel better between seizures. ? A seizure that makes it harder to breathe. ? A seizure and you can no longer speak or use part of your body.  You do not wake up right after a seizure.  You get hurt during a  seizure.  You feel confused or have pain right after a seizure. These symptoms may be an emergency. Get help right away. Call your local emergency services (911 in the [place]  Do not wait to see if the symptoms will go away.  Do not drive yourself to the hospital. Summary  A seizure is a sudden burst of abnormal electrical and chemical activity in the brain. Seizures normally last from 30 seconds to 2 minutes.  Causes of seizures include illness, injury to the head, low levels of blood sugar or salt, and certain conditions.  Most seizures will stop on their own in less than 5 minutes. Seizures that last longer than 5 minutes are a medical emergency and need treatment right away.  Many medicines are used to treat seizures. Take over-the-counter and prescription medicines only as told by your doctor. This information is not intended to  replace advice given to you by your health care provider. Make sure you discuss any questions you have with your health care provider. Document Revised: 01/31/2020 Document Reviewed: 01/31/2020 Elsevier Patient Education  2021 [employer].

## 2021-01-10 NOTE — Plan of Care (Signed)
  Problem: Health Behavior/Discharge Planning: Goal: Compliance with prescribed medication regimen will improve Outcome: Completed/Met

## 2021-01-10 NOTE — Discharge Summary (Signed)
Family Medicine Teaching [facility] Discharge Summary  Patient name: George Valentine Medical record number: [id number] Date of birth: May 01, 1996 Age: 25 y.o. Gender: male Date of Admission: 01/07/2021  Date of Discharge: 01/10/21 Admitting Physician: Jovita Kussmaul, MD  Primary Care Provider: Pcp, No Consultants: Neurology   Indication for Hospitalization:  Seizure   Discharge Diagnoses/Problem List:  Seizure Overactive Bladder Depression  Disposition: Home   Discharge Condition: Improved   Discharge Exam:  GEN: well appearing male, in no acute distress  CV: regular rate and rhythm, no murmurs appreciated  RESP: no increased work of breathing, clear to ascultation bilaterally  ABD: Bowel sounds present. Soft, non-tender, non-distended.  MSK: no LE edema, or calf tenderness  SKIN: warm, dry NEURO: CN II-XII grossly intact, moves all extremities appropriately, strength 5/5 bilateral upper and lower extremities     Brief Hospital Course:   Jori Thrall is a 25 year old male with PMH of overactive bladder treated with implantation of permanent InterSim in 2019 and depression who presented with seizure witnessed by EMS. He was at the [facility] gym and went to ask for some Tylenol because he was not feeling well. After asking for Tylenol he became unresponsive thus EMS was called. When EMS arrived he was seizing and given Versed. He had repeat seizure-like episodes in the ED and received Ativan, Dexamethasone, and Keppra. Of note he was tachycardia and febrile. Lumbar puncture obtained. Neurology evaluated patient. Patient started on empiric antibiotics and acyclovir. EEG did not show seizure activity.  Meningitis including HSV work-up was negative.  As patient had diarrhea, fever and leukocytosis, his seizure threshold was lowered in the setting of an infectious process.  Blood and urine cultures were unremarkable.  MRI brain indicated a post-seizure edema.  Per neurology, patient likely had  undiagnosed epilepsy given he had a seizure at age 80.  He was started on Keppra 1000 mg XR nightly for seizure prophylaxis.  He is to follow-up with [facility].  Patient given seizure precautions and advised by several providers not to drive for the next 6 months.  He voiced understanding of this requirement.    Issues for Follow Up:  1. Remind patient of 6 month driving restriction at follow up.  2. Follow up with additional neurology recommendations.   Significant Procedures:  Lumbar puncture   Significant Labs and Imaging:  Recent Labs  Lab 01/07/21 1516 01/08/21 0214 01/09/21 0319  WBC 23.9* 14.4* 7.6  HGB 15.5 14.6 13.2  HCT 53.1* 45.9 40.5  PLT 349 269 225   Recent Labs  Lab 01/07/21 1516 01/08/21 0214 01/09/21 0319  NA 137 137 139  K 5.0 4.1 3.5  CL 100 104 109  CO2 15* 25 24  GLUCOSE 223* 108* 100*  BUN 16 9 8   CREATININE 1.82* 1.15 1.01  CALCIUM 9.1 9.1 8.5*  MG 2.0  --   --   ALKPHOS 91  --   --   AST 35  --   --   ALT 23  --   --   ALBUMIN 4.3  --   --     CT HEAD WO CONTRAST  Result Date: 01/07/2021 CLINICAL DATA:  Recent seizure activity with decreased responsiveness EXAM: CT HEAD WITHOUT CONTRAST TECHNIQUE: Contiguous axial images were obtained from the base of the skull through the vertex without intravenous contrast. COMPARISON:  None. FINDINGS: Brain: No evidence of acute infarction, hemorrhage, hydrocephalus, extra-axial collection or mass lesion/mass effect. Vascular: No hyperdense vessel or unexpected calcification.  Skull: Normal. Negative for fracture or focal lesion. Sinuses/Orbits: No acute finding. Other: None. IMPRESSION: No acute intracranial abnormality noted. Electronically Signed   By: Alcide Clever M.D.   On: 01/07/2021 16:52   MR BRAIN W WO CONTRAST  Result Date: 01/08/2021 CLINICAL DATA:  Recent seizure activity. Decreased level of consciousness. EXAM: MRI HEAD WITHOUT AND WITH CONTRAST TECHNIQUE: Multiplanar, multiecho  pulse sequences of the brain and surrounding structures were obtained without and with intravenous contrast. CONTRAST:  9.58mL GADAVIST GADOBUTROL 1 MMOL/ML IV SOLN COMPARISON:  Head CT yesterday. FINDINGS: Brain: Restricted diffusion present within the hippocampus on the right, likely postictal phenomenon. The study is considerably degraded by patient motion. The remainder the brain has a normal appearance. No evidence of malformation. No pre-existing mesial temporal abnormality discernible. After contrast administration, no abnormal enhancement occurs. Vascular: Major vessels at the base of the brain show flow. Skull and upper cervical spine: Negative Sinuses/Orbits: Clear/normal Other: None IMPRESSION: Motion degraded exam. Restricted diffusion in the right hippocampus, most likely indicating postictal phenomenon. No discernible pre-existing brain lesion. Electronically Signed   By: Paulina Fusi M.D.   On: 01/08/2021 18:15   DG Chest Port 1 View  Result Date: 01/07/2021 CLINICAL DATA:  Seizure.  Unresponsive. EXAM: PORTABLE CHEST 1 VIEW COMPARISON:  None. FINDINGS: The heart size and mediastinal contours are within normal limits. Both lungs are clear. The visualized skeletal structures are unremarkable. IMPRESSION: No active disease. Electronically Signed   By: Obie Dredge M.D.   On: 01/07/2021 15:30   EEG adult  Result Date: 01/08/2021 Charlsie Quest, MD     01/08/2021 10:26 AM Patient Name: Braelyn Bordonaro MRN: [id number] Epilepsy Attending: Charlsie Quest Referring Physician/Provider: Dr Brooke Dare Date: 01/07/2021 Duration: 24.32 mins Patient history: 25 year old male presented with breakthrough seizure.  EEG to evaluate for seizures. Level of alertness: Asleep AEDs during EEG study: None Technical aspects: This EEG study was done with scalp electrodes positioned according to the 10-20 International system of electrode placement. Electrical activity was acquired at a sampling rate of 500Hz  and  reviewed with a high frequency filter of 70Hz  and a low frequency filter of 1Hz . EEG data were recorded continuously and digitally stored. Description: Sleep was characterized by vertex waves, sleep spindles (12 to 14 Hz), maximal frontocentral region.  EEG showed continuous generalized maximal right temporal region 3 to 5 Hz theta-delta slowing. Hyperventilation and photic stimulation were not performed.   ABNORMALITY -  continuous slow, generalized and maximal right temporal region IMPRESSION: This study during sleep only is suggestive of cortical dysfunction in right temporal region which could be secondary to underlying structural abnormality as well as mild to moderate moderate diffuse encephalopathy, nonspecific etiology.  No seizures or definite epileptiform discharges were seen throughout the recording. If suspicion for ictal-interictal activity remains a concern, a prolonged study should be considered.   ECHOCARDIOGRAM COMPLETE  Result Date: 01/08/2021    ECHOCARDIOGRAM REPORT   Patient Name:   NAMEER SUMMER Date of Exam: 01/08/2021 Medical Rec #:  03/10/2021   Height:       72.0 in Accession #:    Sharmon Leyden  Weight:       220.0 lb Date of Birth:  1995/08/16    BSA:          2.219 m Patient Age:    25 years    BP:           120/63 mmHg Patient Gender: M  HR:           104 bpm. Exam Location:  Inpatient Procedure: 2D Echo Indications:    Ventricular tachycardia  History:        Patient has no prior history of Echocardiogram examinations.                 Seizures.  Sonographer:    Delcie Roch Referring Phys: [id number] ANJALI DAGAR IMPRESSIONS  1. Left ventricular ejection fraction, by estimation, is 60 to 65%. The left ventricle has normal function. The left ventricle has no regional wall motion abnormalities. Left ventricular diastolic parameters were normal.  2. Right ventricular systolic function is normal. The right ventricular size is normal.  3. The mitral valve is normal in  structure. Trivial mitral valve regurgitation. No evidence of mitral stenosis.  4. The aortic valve is tricuspid. Aortic valve regurgitation is not visualized. No aortic stenosis is present.  5. The inferior vena cava is normal in size with greater than 50% respiratory variability, suggesting right atrial pressure of 3 mmHg. Comparison(s): No prior Echocardiogram. FINDINGS  Left Ventricle: Left ventricular ejection fraction, by estimation, is 60 to 65%. The left ventricle has normal function. The left ventricle has no regional wall motion abnormalities. The left ventricular internal cavity size was normal in size. There is  no left ventricular hypertrophy. Left ventricular diastolic parameters were normal. Right Ventricle: The right ventricular size is normal. No increase in right ventricular wall thickness. Right ventricular systolic function is normal. Left Atrium: Left atrial size was normal in size. Right Atrium: Right atrial size was normal in size. Pericardium: There is no evidence of pericardial effusion. Mitral Valve: The mitral valve is normal in structure. Trivial mitral valve regurgitation. No evidence of mitral valve stenosis. Tricuspid Valve: The tricuspid valve is normal in structure. Tricuspid valve regurgitation is trivial. Aortic Valve: The aortic valve is tricuspid. Aortic valve regurgitation is not visualized. No aortic stenosis is present. Pulmonic Valve: The pulmonic valve was normal in structure. Pulmonic valve regurgitation is trivial. Aorta: The aortic root and ascending aorta are structurally normal, with no evidence of dilitation. Venous: The inferior vena cava is normal in size with greater than 50% respiratory variability, suggesting right atrial pressure of 3 mmHg. IAS/Shunts: No atrial level shunt detected by color flow Doppler.  LEFT VENTRICLE PLAX 2D LVIDd:         5.00 cm  Diastology LVIDs:         3.10 cm  LV e' medial:    11.30 cm/s LV PW:         1.00 cm  LV E/e' medial:  7.8 LV  IVS:        1.10 cm  LV e' lateral:   13.90 cm/s LVOT diam:     2.30 cm  LV E/e' lateral: 6.4 LV SV:         81 LV SV Index:   36 LVOT Area:     4.15 cm  RIGHT VENTRICLE             IVC RV S prime:     15.80 cm/s  IVC diam: 1.60 cm TAPSE (M-mode): 2.5 cm LEFT ATRIUM           Index       RIGHT ATRIUM           Index LA diam:      2.80 cm 1.26 cm/m  RA Area:     18.90 cm LA Vol (A4C): 51.6 ml  23.26 ml/m RA Volume:   55.30 ml  24.93 ml/m  AORTIC VALVE LVOT Vmax:   126.00 cm/s LVOT Vmean:  83.900 cm/s LVOT VTI:    0.194 m  AORTA Ao Asc diam: 2.80 cm MITRAL VALVE MV Area (PHT): 4.41 cm    SHUNTS MV Decel Time: 172 msec    Systemic VTI:  0.19 m MV E velocity: 88.30 cm/s  Systemic Diam: 2.30 cm MV A velocity: 77.10 cm/s MV E/A ratio:  1.15 Laurance FlattenHeather Pemberton MD Electronically signed by Laurance FlattenHeather Pemberton MD Signature Date/Time: 01/08/2021/3:24:36 PM    Final      Results/Tests Pending at Time of Discharge:   Discharge Medications:  Allergies as of 01/10/2021   No Known Allergies     Medication List    TAKE these medications   levETIRAcetam ER 1000 MG Tb24 Take 1,000 mg by mouth at bedtime.   mometasone 0.1 % cream Commonly known as: ELOCON Apply 1 application topically See admin instructions. Apply to affected area two times a day for no more than 2 weeks       Discharge Instructions: Please refer to Patient Instructions section of EMR for full details.  Patient was counseled important signs and symptoms that should prompt return to medical care, changes in medications, dietary instructions, activity restrictions, and follow up appointments.   Follow-Up Appointments:  Follow-up Information    [facility]. Schedule an appointment as soon as possible for a visit.   Specialty: Neurology Contact information: [address] [place] [id number] [phone]              Katha CabalBrimage, Nevin Grizzle, DO 01/10/2021, 1:42 PM PGY-2 [facility]

## 2021-01-12 ENCOUNTER — Other Ambulatory Visit (HOSPITAL_COMMUNITY): Payer: Self-pay

## 2021-01-12 LAB — CULTURE, BLOOD (ROUTINE X 2): Culture: NO GROWTH

## 2021-01-12 MED ORDER — LEVETIRACETAM ER 500 MG PO TB24
1000.0000 mg | ORAL_TABLET | Freq: Every day | ORAL | 0 refills | Status: DC
  Filled 2021-01-12: qty 60, 30d supply, fill #0

## 2021-01-13 LAB — CULTURE, BLOOD (ROUTINE X 2)
Culture: NO GROWTH
Special Requests: ADEQUATE

## 2021-02-02 ENCOUNTER — Encounter: Payer: Self-pay | Admitting: Neurology

## 2021-02-02 ENCOUNTER — Ambulatory Visit (INDEPENDENT_AMBULATORY_CARE_PROVIDER_SITE_OTHER): Payer: No Typology Code available for payment source | Admitting: Neurology

## 2021-02-02 VITALS — BP 143/93 | HR 72 | Ht 72.0 in | Wt 246.0 lb

## 2021-02-02 DIAGNOSIS — R569 Unspecified convulsions: Secondary | ICD-10-CM | POA: Diagnosis not present

## 2021-02-02 DIAGNOSIS — R39198 Other difficulties with micturition: Secondary | ICD-10-CM | POA: Diagnosis not present

## 2021-02-02 MED ORDER — LEVETIRACETAM ER 750 MG PO TB24: 1500.0000 mg | ORAL_TABLET | Freq: Every day | ORAL | 11 refills | Status: DC

## 2021-02-02 NOTE — Progress Notes (Signed)
[facility]  PATIENT: George Valentine DOB: [birth year]  REFERRING DOCTOR OR PCP:  Shelby Dubin, MD; Katherine Basset, PA-C (PCP) SOURCE: Patient, emergency room and admission notes, imaging and lab reports, MRI and CT images personally reviewed.  _________________________________   HISTORICAL  CHIEF COMPLAINT:  Chief Complaint  Patient presents with   New Patient (Initial Visit)    RM 13, alone. Internal referral from Brooke Dare, MD for seizures. He was admitted at [facility] 01/07/21-01/10/21. Before presenting to ED, he was at the gym, started to not feel well, asked for Tylenol and then became unresponsive/had seizure. Brought to ED by EMS. No seizure since. No family hx of sx. Had seizures when he was young (age [age]) and had another one at age 69.     HISTORY OF PRESENT ILLNESS:  I had the pleasure of seeing your patient, George Valentine, at [facility] for neurologic consultation regarding his seizures.    He is a 25 year old man with a recent hospitalization for status epilepticus.  Around 3 pm 01/07/2021 he was in the gym and initially felt fine.   While at a gym, he was not feeling well and he asked staff for some Tylenol.  He then started seizing and has no recollection of the rest of that day.  He became unresponsive and EMS was called.  He was seizing when EMS arrived.  He was given Versed in the field and brought to [facility] emergency room.  He had repeat seizure episodes in the emergency room and received Ativan x2. i he also received dexamethasone and Keppra.  A lumbar puncture was performed but the meningitis work-up including HSV was negative.  In the emergency room, a CT scan was normal.  The first day, he received vancomycin, ceftriaxone and acyclovir.  An MRI of the brain the next day was not consistent with a post seizure restricted diffusion in the right hippocampus.  EEG 01/08/2021 showed continuous slow generalized right temporal activity  suggestive of right temporal cortical dysfunction.  There were no seizures or epileptic discharges.   He was discharged 3 days later.   He improved and was discharged on Keppra 1000 mg XR nightly.   He did have some headaches theat work and intermittently since.     He notes the preceding 2-3 days were normal and he had 6 hours of sleep or more.   No fevers.    He took a pre workout energy drink with caffeine and creatine.  He does not think he was dehydrated at the time.    Okay thanks  Initial labs showed increased glucose and creatinine and reduced CO2.   CK was elevated.  WBC were elevated.  Magnesium was normal.  Urine drug screen was consistent (just showed benzodiazepines which she had received)  He had 2 or 3 seizures at age 54 or 4 and also had a seizure at age 42.  He knows he was stressed at that time.   It occurred in the classroom.  He recalls the onset of the seizure with his teacher asking if he was ok and then he lost consciousness and had a GTC seizure.  He did not go to the ED at the time and never saw a doctor about it.     He has a past medical history of overactive bladder treated with an InterStim device in 2019.  An etiology was never found.   Medications had not helped enough.     IMAGING I personally  reviewed the CT scan of the head 01/07/2021 and the MRI of the brain 01/08/2021. CT of the head 01/07/2021 was normal.  MRI of the head 01/08/2021 showed restricted diffusion in the right hippocampus.  The brain was otherwise normal.  This is most consistent with transient changes from the seizures    REVIEW OF SYSTEMS: Constitutional: No fevers, chills, sweats, or change in appetite Eyes: No visual changes, double vision, eye pain Ear, nose and throat: No hearing loss, ear pain, nasal congestion, sore throat Cardiovascular: No chest pain, palpitations Respiratory:  No shortness of breath at rest or with exertion.   No wheezes GastrointestinaI: No nausea, vomiting, diarrhea,  abdominal pain, fecal incontinence Genitourinary:  No dysuria, urinary retention or frequency.  No nocturia. Musculoskeletal:  No neck pain, back pain Integumentary: No rash, pruritus, skin lesions Neurological: as above Psychiatric: No depression at this time.  No anxiety Endocrine: No palpitations, diaphoresis, change in appetite, change in weigh or increased thirst Hematologic/Lymphatic:  No anemia, purpura, petechiae. Allergic/Immunologic: No itchy/runny eyes, nasal congestion, recent allergic reactions, rashes  ALLERGIES: No Known Allergies  HOME MEDICATIONS:  Current Outpatient Medications:    mometasone (ELOCON) 0.1 % cream, Apply 1 application topically See admin instructions. Apply to affected area two times a day for no more than 2 weeks, Disp: , Rfl:    levETIRAcetam 750 MG TB24, Take 2 tablets (1,500 mg total) by mouth daily., Disp: 60 tablet, Rfl: 11  PAST MEDICAL HISTORY: Past Medical History:  Diagnosis Date   Overactive bladder     PAST SURGICAL HISTORY: Past Surgical History:  Procedure Laterality Date   implantation intersim  2019   WISDOM TOOTH EXTRACTION     x4    FAMILY HISTORY: Family History  Problem Relation Age of Onset   Diabetes Mother    Cancer - Lung Father     SOCIAL HISTORY:  Social History   Socioeconomic History   Marital status: Single    Spouse name: Not on file   Number of children: 0   Years of education: BA   Highest education level: Not on file  Occupational History   Not on file  Tobacco Use   Smoking status: Never   Smokeless tobacco: Never  Substance and Sexual Activity   Alcohol use: Yes    Comment: ocass   Drug use: Never   Sexual activity: Not on file  Other Topics Concern   Not on file  Social History Narrative   Right handed   Caffeine use: 5-6 days per week   Social Determinants of Health   Financial Resource Strain: Not on file  Food Insecurity: Not on file  Transportation Needs: Not on file   Physical Activity: Not on file  Stress: Not on file  Social Connections: Not on file  Intimate Partner Violence: Not on file     PHYSICAL EXAM  Vitals:   02/02/21 1030  BP: (!) 143/93  Pulse: 72  Weight: 246 lb (111.6 kg)  Height: 6' (1.829 m)    Body mass index is 33.36 kg/m.   General: The patient is well-developed and well-nourished and in no acute distress  HEENT:  Head is [place]/AT.  Sclera are anicteric.  Funduscopic exam shows normal optic discs and retinal vessels.  Neck: No carotid bruits are noted.  The neck is nontender.  Cardiovascular: The heart has a regular rate and rhythm with a normal S1 and S2. There were no murmurs, gallops or rubs.    Skin: Extremities are  without rash or  edema.  Musculoskeletal:  Back is nontender  Neurologic Exam  Mental status: The patient is alert and oriented x 3 at the time of the examination. The patient has apparent normal recent and remote memory, with an apparently normal attention span and concentration ability.   Speech is normal.  Cranial nerves: Extraocular movements are full. Pupils are equal, round, and reactive to light and accomodation.  Visual fields are full.  Facial symmetry is present. There is good facial sensation to soft touch bilaterally.Facial strength is normal.  Trapezius and sternocleidomastoid strength is normal. No dysarthria is noted.  The tongue is midline, and the patient has symmetric elevation of the soft palate. No obvious hearing deficits are noted.  Motor:  Muscle bulk is normal.   Tone is normal. Strength is  5 / 5 in all 4 extremities.   Sensory: Sensory testing is intact to pinprick, soft touch and vibration sensation in all 4 extremities.  Coordination: Cerebellar testing reveals good finger-nose-finger and heel-to-shin bilaterally.  Gait and station: Station is normal.   Gait is normal. Tandem gait is normal. Romberg is negative.   Reflexes: Deep tendon reflexes are symmetric and normal  bilaterally.   Plantar responses are flexor.    DIAGNOSTIC DATA (LABS, IMAGING, TESTING) - I reviewed patient records, labs, notes, testing and imaging myself where available.  Lab Results  Component Value Date   WBC 7.6 01/09/2021   HGB 13.2 01/09/2021   HCT 40.5 01/09/2021   MCV 81.8 01/09/2021   PLT 225 01/09/2021      Component Value Date/Time   NA 139 01/09/2021 0319   K 3.5 01/09/2021 0319   CL 109 01/09/2021 0319   CO2 24 01/09/2021 0319   GLUCOSE 100 (H) 01/09/2021 0319   BUN 8 01/09/2021 0319   CREATININE 1.01 01/09/2021 0319   CALCIUM 8.5 (L) 01/09/2021 0319   PROT 7.0 01/07/2021 1516   ALBUMIN 4.3 01/07/2021 1516   AST 35 01/07/2021 1516   ALT 23 01/07/2021 1516   ALKPHOS 91 01/07/2021 1516   BILITOT 0.9 01/07/2021 1516   GFRNONAA >60 01/09/2021 0319   No results found for: CHOL, HDL, LDLCALC, LDLDIRECT, TRIG, CHOLHDL Lab Results  Component Value Date   HGBA1C 5.5 01/08/2021       ASSESSMENT AND PLAN  Seizure (HCC) - Plan: EEG adult  Urinary dysfunction  In summary, George Valentine is a 25 year old man with several seizures on 01/07/2021 with a history of seizures as a toddler and 1 seizure at age 44.  The etiology of the seizures is uncertain.  It is possible that he had dehydration and/or a mild viral infection at the time of the seizures last month.  MRI showed restricted diffusion in the right hippocampus that would be consistent with a postictal state.  EEG could also be consistent with a postictal state.  Underlying dysfunction cannot be ruled out.  I had a discussion with him that due to the fact that he has had several seizures in the past he is at a higher risk of repeat seizure than the general population.  Therefore, he needs to stay on an antiepileptic drug, for at least several years.  Because of his age and size I will increase the dose from 1000 mg daily to 1500 mg daily.  He is tolerating levetiracetam very well.  We will also check an EEG.  If  normalized, the changes seen earlier this month would be due to the postictal state.  If  there is still significant asymmetry, he could have underlying dysfunction.  Has had an InterStim placed for bladder dysfunction.  The etiology is unclear.  He did not have long track signs in the legs so we will hold off on any spine imaging at this time.  He will return to see me in 12 months but is advised to call if he has any more seizures or new neurologic symptoms.  Thank you for asking me to see George Valentine.  Please let me know if I can be of further assistance with him or other patients in the future.  Carlos Heber A. Epimenio FootSater, MD, [facility],FAAN 02/02/2021, 12:46 PM Certified in Neurology, Clinical Neurophysiology, Sleep Medicine and Neuroimaging  [facility] [address], [address] [place], [place] [id number] [phone]

## 2021-03-03 ENCOUNTER — Telehealth: Payer: Self-pay | Admitting: Neurology

## 2021-03-03 NOTE — Telephone Encounter (Signed)
EEG order sent to [facility] due to staffing concerns at [facility] for testing. They will call patient to schedule.

## 2021-03-22 ENCOUNTER — Ambulatory Visit (INDEPENDENT_AMBULATORY_CARE_PROVIDER_SITE_OTHER): Payer: No Typology Code available for payment source | Admitting: Neurology

## 2021-03-22 ENCOUNTER — Other Ambulatory Visit: Payer: Self-pay

## 2021-03-22 DIAGNOSIS — R569 Unspecified convulsions: Secondary | ICD-10-CM

## 2021-03-22 NOTE — Progress Notes (Signed)
    [facility]  EEG (ELECTROENCEPHALOGRAM) REPORT   STUDY DATE: 03/22/2021 PATIENT NAME: George Valentine DOB: [birth year] MRN: [id number]  ORDERING CLINICIAN: Leiyah Maultsby A. Kamel Haven, MD. PhD   TECHNIQUE: Electroencephalogram was recorded utilizing standard 10-20 system of lead placement and reformatted into average and bipolar montages.  RECORDING TIME: 22 minutes  CLINICAL INFORMATION: 25 year old man with seizures  FINDINGS: A digital EEG was performed while the patient was awake and drowsy. While awake and most alert there was a 9 1/2 hz posterior dominant rhythm. Voltages and frequencies were symmetric.  There were no focal, lateralizing, epileptiform activity or seizures seen.  Photic stimulation had a normal driving response.  EKG channel shows normal sinus rhythm.  The patient was briefly drowsy but did not enter definitive sleep.  IMPRESSION: This is a normal EEG while the patient was awake and drowsy.   INTERPRETING PHYSICIAN:   Mallory Enriques A. Epimenio Foot, MD, PhD, [facility] Certified in Neurology, Clinical Neurophysiology, Sleep Medicine, Pain Medicine and Neuroimaging  [facility] [address] [place], [place] [address] [phone]

## 2021-03-22 NOTE — Progress Notes (Signed)
EEG completed at [facility]. Full report to follow by Dr. Epimenio Foot

## 2022-02-02 ENCOUNTER — Encounter: Payer: Self-pay | Admitting: Family Medicine

## 2022-02-02 ENCOUNTER — Ambulatory Visit (INDEPENDENT_AMBULATORY_CARE_PROVIDER_SITE_OTHER): Payer: 59 | Admitting: Family Medicine

## 2022-02-02 VITALS — BP 126/83 | HR 72 | Ht 74.0 in | Wt 269.0 lb

## 2022-02-02 DIAGNOSIS — R569 Unspecified convulsions: Secondary | ICD-10-CM

## 2022-02-02 MED ORDER — LEVETIRACETAM ER 750 MG PO TB24: 1500.0000 mg | ORAL_TABLET | Freq: Every day | ORAL | 3 refills | Status: DC

## 2022-02-02 NOTE — Progress Notes (Signed)
Chief Complaint  Patient presents with   Follow-up    Rm 17 alone here for yearly f/u- Reports he has been doing ok denies any seizures. He feels like the meds are causing stomach pain and headaches. Sx are present to a degree everyday.    HISTORY OF PRESENT ILLNESS:  02/02/22 ALL:  George Valentine is a 26 y.o. male here today for follow up for seizures. He continues levetiracetam XR 1500mg  daily. He is doing fairly well. No obvious adverse effects but he does mention having headaches about 2-3 times a week. He describes pain as a pressure sensation that is usually short lived. Sometimes he feels a floating sensation. He reports an unusual sensation in his stomach at times but unable to describe. He feels this happens about twice a month. He denies any seizure activity. He is driving without difficulty. He lives with his mother and sister. He is sleeping well. Usually 11p-7a. He wakes 1-2 times a niht to urinate and reports overactive bladder followed by urology. He does snore but denies apnea concerns. He drinks about 80 ounces of water, daily. 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   HISTORY (copied from Dr Consulting civil engineer previous note)  I had the pleasure of seeing your patient, George Valentine, at [facility] for neurologic consultation regarding his seizures.     He is a 26 year old man with a recent hospitalization for status epilepticus.  Around 3 pm 01/07/2021 he was in the gym and initially felt fine.   While at a gym, he was not feeling well and he asked staff for some Tylenol.  He then started seizing and has no recollection of the rest of that day.  He became unresponsive and EMS was called.  He was seizing when EMS arrived.  He was given Versed in the field and brought to [facility] emergency room.  He had repeat seizure episodes in the emergency room and received Ativan x2. i he also received dexamethasone and Keppra.  A lumbar puncture was performed but the  meningitis work-up including HSV was negative.  In the emergency room, a CT scan was normal.  The first day, he received vancomycin, ceftriaxone and acyclovir.  An MRI of the brain the next day was not consistent with a post seizure restricted diffusion in the right hippocampus.  EEG 01/08/2021 showed continuous slow generalized right temporal activity suggestive of right temporal cortical dysfunction.  There were no seizures or epileptic discharges.   He was discharged 3 days later.   He improved and was discharged on Keppra 1000 mg XR nightly.   He did have some headaches theat work and intermittently since.      He notes the preceding 2-3 days were normal and he had 6 hours of sleep or more.   No fevers.     He took a pre workout energy drink with caffeine and creatine.  He does not think he was dehydrated at the time.    Okay thanks   Initial labs showed increased glucose and creatinine and reduced CO2.   CK was elevated.  WBC were elevated.  Magnesium was normal.  Urine drug screen was consistent (just showed benzodiazepines which she had received)   He had 2 or 3 seizures at age 50 or 4 and also had a seizure at age 39.  He knows he was stressed at that time.   It occurred in the classroom.  He recalls the onset of the  seizure with his teacher asking if he was ok and then he lost consciousness and had a GTC seizure.  He did not go to the ED at the time and never saw a doctor about it.      He has a past medical history of overactive bladder treated with an InterStim device in 2019.  An etiology was never found.   Medications had not helped enough.      IMAGING I personally reviewed the CT scan of the head 01/07/2021 and the MRI of the brain 01/08/2021. CT of the head 01/07/2021 was normal.   MRI of the head 01/08/2021 showed restricted diffusion in the right hippocampus.  The brain was otherwise normal.  This is most consistent with transient changes from the seizures   REVIEW OF SYSTEMS: Out of a  complete 14 system review of symptoms, the patient complains only of the following symptoms, headaches, unusual sensation in abdomen, urinary frequency and all other reviewed systems are negative.   ALLERGIES: No Known Allergies   HOME MEDICATIONS: Outpatient Medications Prior to Visit  Medication Sig Dispense Refill   mometasone (ELOCON) 0.1 % cream Apply 1 application topically See admin instructions. Apply to affected area two times a day for no more than 2 weeks     levETIRAcetam 750 MG TB24 Take 2 tablets (1,500 mg total) by mouth daily. 60 tablet 11   No facility-administered medications prior to visit.     PAST MEDICAL HISTORY: Past Medical History:  Diagnosis Date   Overactive bladder      PAST SURGICAL HISTORY: Past Surgical History:  Procedure Laterality Date   implantation intersim  2019   WISDOM TOOTH EXTRACTION     x4     FAMILY HISTORY: Family History  Problem Relation Age of Onset   Diabetes Mother    Cancer - Lung Father      SOCIAL HISTORY: Social History   Socioeconomic History   Marital status: Single    Spouse name: Not on file   Number of children: 0   Years of education: BA   Highest education level: Not on file  Occupational History   Not on file  Tobacco Use   Smoking status: Never   Smokeless tobacco: Never  Substance and Sexual Activity   Alcohol use: Yes    Comment: ocass   Drug use: Never   Sexual activity: Not on file  Other Topics Concern   Not on file  Social History Narrative   Right handed   Caffeine use: 5-6 days per week   Social Determinants of Health   Financial Resource Strain: Not on file  Food Insecurity: Not on file  Transportation Needs: Not on file  Physical Activity: Not on file  Stress: Not on file  Social Connections: Not on file  Intimate Partner Violence: Not on file     PHYSICAL EXAM  Vitals:   02/02/22 1113  BP: 126/83  Pulse: 72  Weight: 269 lb (122 kg)  Height: 6\' 2"  (1.88 m)    Body mass index is 34.54 kg/m.  Generalized: Well developed, in no acute distress  Cardiology: normal rate and rhythm, no murmur auscultated  Respiratory: clear to auscultation bilaterally    Neurological examination  Mentation: Alert oriented to time, place, history taking. Follows all commands speech and language fluent Cranial nerve II-XII: Pupils were equal round reactive to light. Extraocular movements were full, visual field were full on confrontational test. Facial sensation and strength were normal. Uvula tongue midline.  Head turning and shoulder shrug  were normal and symmetric. Motor: The motor testing reveals 5 over 5 strength of all 4 extremities. Good symmetric motor tone is noted throughout.  Sensory: Sensory testing is intact to soft touch on all 4 extremities. No evidence of extinction is noted.  Coordination: Cerebellar testing reveals good finger-nose-finger and heel-to-shin bilaterally.  Gait and station: Gait is normal. Tandem gait is normal. Romberg is negative. No drift is seen.  Reflexes: Deep tendon reflexes are symmetric and normal bilaterally.    DIAGNOSTIC DATA (LABS, IMAGING, TESTING) - I reviewed patient records, labs, notes, testing and imaging myself where available.  Lab Results  Component Value Date   WBC 7.6 01/09/2021   HGB 13.2 01/09/2021   HCT 40.5 01/09/2021   MCV 81.8 01/09/2021   PLT 225 01/09/2021      Component Value Date/Time   NA 139 01/09/2021 0319   K 3.5 01/09/2021 0319   CL 109 01/09/2021 0319   CO2 24 01/09/2021 0319   GLUCOSE 100 (H) 01/09/2021 0319   BUN 8 01/09/2021 0319   CREATININE 1.01 01/09/2021 0319   CALCIUM 8.5 (L) 01/09/2021 0319   PROT 7.0 01/07/2021 1516   ALBUMIN 4.3 01/07/2021 1516   AST 35 01/07/2021 1516   ALT 23 01/07/2021 1516   ALKPHOS 91 01/07/2021 1516   BILITOT 0.9 01/07/2021 1516   GFRNONAA >60 01/09/2021 0319   No results found for: "CHOL", "HDL", "LDLCALC", "LDLDIRECT", "TRIG", "CHOLHDL" Lab  Results  Component Value Date   HGBA1C 5.5 01/08/2021   No results found for: "VITAMINB12" No results found for: "TSH"      No data to display               No data to display           ASSESSMENT AND PLAN  26 y.o. year old male  has a past medical history of Overactive bladder. here with    Seizure (HCC) - Plan: Levetiracetam level, Levetiracetam 750 MG TB24  George Valentine is doing well, today. No seizure activity since continuing levetiracetam XR 1500mg  daily. He does report headaches 2-3 times a week and an unusual sensation in his abdomen about twice a month. It is unclear if symptoms are related to AED. He is willing to continue monitoring symptoms. We will check AED level to make sure it is therapeutic. Discussed monitoring for aura symptoms. He will continue close follow up with PCP and urology. Healthy lifestyle habits encouraged. He will follow up with PCP as directed. He will return to see me in 1 year, sooner if needed. He verbalizes understanding and agreement with this plan.    Orders Placed This Encounter  Procedures   Levetiracetam level     Meds ordered this encounter  Medications   Levetiracetam 750 MG TB24    Sig: Take 2 tablets (1,500 mg total) by mouth daily.    Dispense:  180 tablet    Refill:  3    Order Specific Question:   Supervising Provider    Answer:   Anson Fret    I spent 30 minutes of face-to-face and non-face-to-face time with patient.  This included previsit chart review, lab review, study review, order entry, electronic health record documentation, patient education.   [id number], MSN, FNP-C 02/02/2022, 3:22 PM  [facility] [address] [place], [place] [phone]

## 2022-02-02 NOTE — Patient Instructions (Signed)
Below is our plan:  We will continue levetiracetam XR 1500  Please make sure you are staying well hydrated. I recommend 50-60 ounces daily. Well balanced diet and regular exercise encouraged. Consistent sleep schedule with 6-8 hours recommended.   Please continue follow up with care team as directed.   Follow up with me in 1 year if doing well, sooner if needed   You may receive a survey regarding today's visit. I encourage you to leave honest feed back as I do use this information to improve patient care. Thank you for seeing me today!

## 2022-02-03 LAB — LEVETIRACETAM LEVEL: Levetiracetam Lvl: 10.7 ug/mL (ref 10.0–40.0)

## 2022-10-04 IMAGING — MR MR HEAD WO/W CM
20 of 23 series · 38 of 48 positions shown · IV contrast (Gadavist)
Comparison: Head CT yesterday.

CLINICAL DATA: Recent seizure activity. Decreased level of
consciousness.

EXAM:
MRI HEAD WITHOUT AND WITH CONTRAST
TECHNIQUE: Multiplanar, multiecho pulse sequences of the brain and surrounding
structures were obtained without and with intravenous contrast.
CONTRAST:  9.9mL GADAVIST GADOBUTROL 1 MMOL/ML IV SOLN

[Series 9: ax dwi_tracew · axial · 4.0mm · 0.57mm/px · z∈[-50,+98]mm · 3 of 76 slices shown]
[im 1/76]
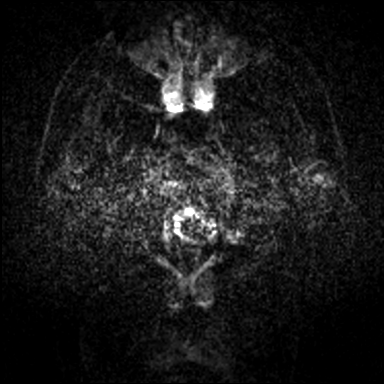
[im 38/76]
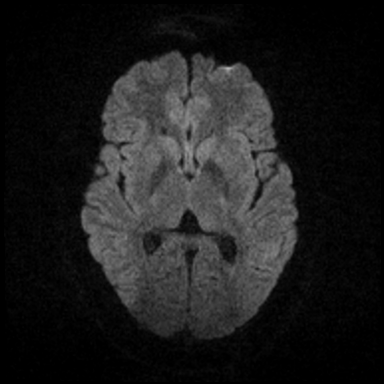
[im 76/76]
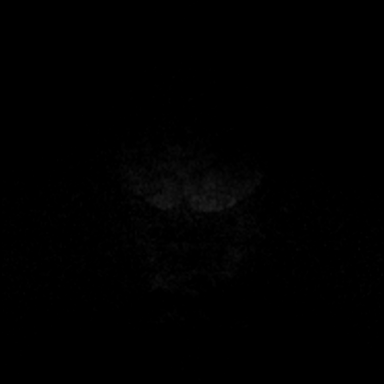

[Series 10: ax dwi_adc · axial · 4.0mm · 0.57mm/px · 1 of 38 slices shown]
[im 1/38]
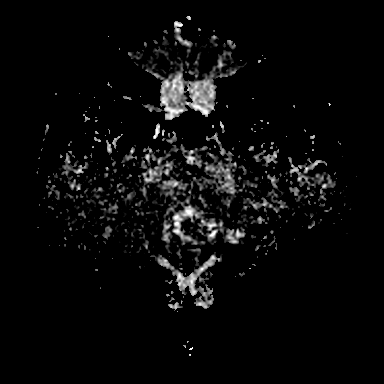

[Series 11: cor dwi_tracew · coronal · 4.0mm · 0.78mm/px · 3 of 76 slices shown]
[im 1/76]
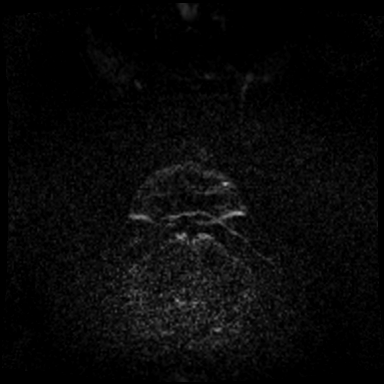
[im 38/76]
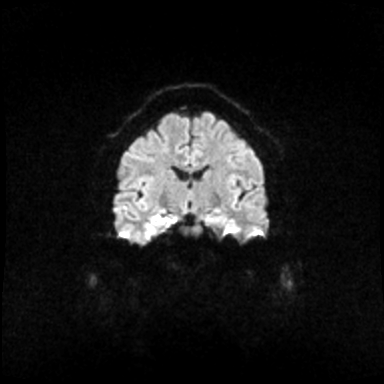
[im 76/76]
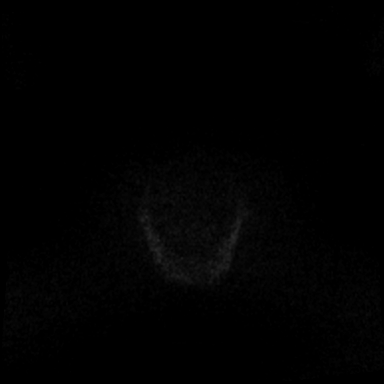

[Series 12: cor dwi_adc · coronal · 4.0mm · 0.78mm/px · 1 of 38 slices shown]
[im 1/38]
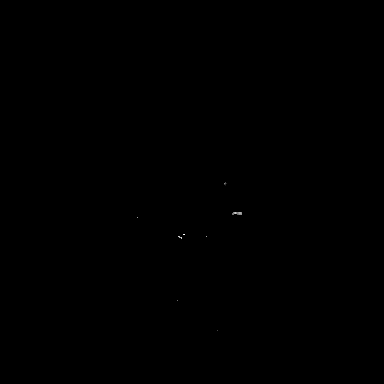

[Series 13: T1 · sagittal · 5.0mm · 0.75mm/px · 1 of 23 slices shown]
[im 1/23]
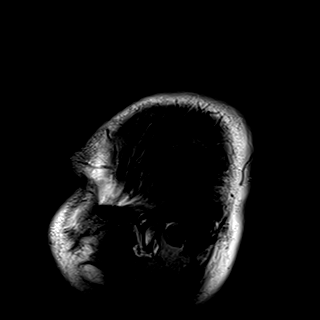

[Series 14: T2 · axial · 5.0mm · 0.72mm/px · 1 of 24 slices shown (1 of 3)]
[im 1/24]
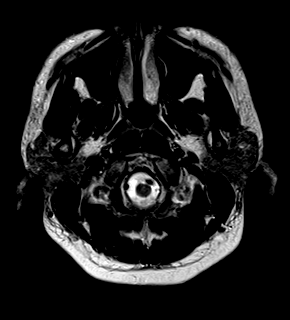

[Series 15: FLAIR · axial · 5.0mm · 0.72mm/px · 1 of 24 slices shown (1 of 2)]
[im 1/24]
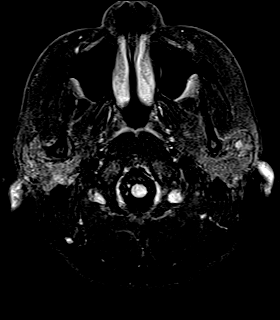

[Series 22: mag_images · axial · 3.0mm · 0.90mm/px · 1 of 16 slices shown (1 of 2)]
[im 1/16]
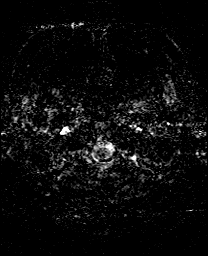

[Series 23: pha_images · axial · 3.0mm · 0.90mm/px · z∈[-58,+104]mm · 3 of 55 slices shown (1 of 2)]
[im 1/55]
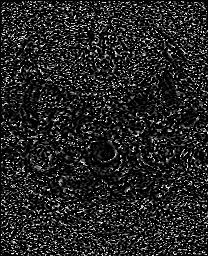
[im 28/55]
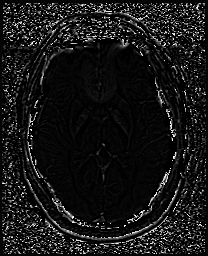
[im 55/55]
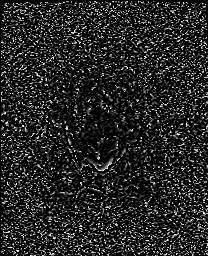

[Series 24: swi_images_nonorm · axial · 3.0mm · 0.90mm/px · z∈[-58,+107]mm · 3 of 56 slices shown (1 of 2)]
[im 1/56]
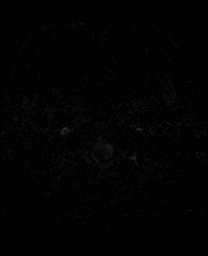
[im 28/56]
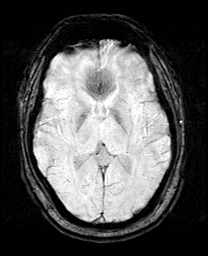
[im 56/56]
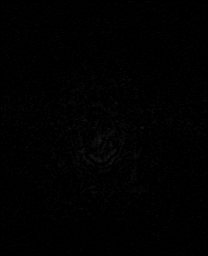

[Series 25: mag_images_nonorm · axial · 3.0mm · 0.90mm/px · z∈[-10,+107]mm · 2 of 40 slices shown (1 of 2)]
[im 1/40]
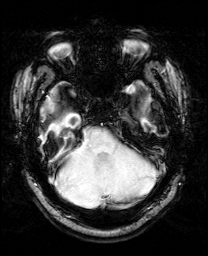
[im 40/40]
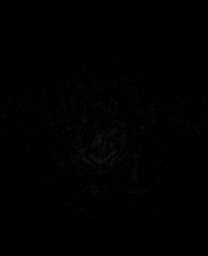

[Series 26: mip_images(sw) · axial · 24.0mm · 0.90mm/px · z∈[-48,+96]mm · 2 of 49 slices shown]
[im 1/49]
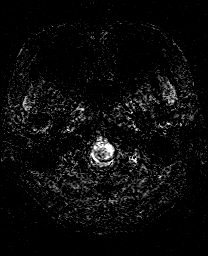
[im 49/49]
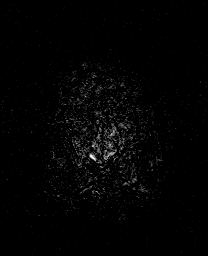

[Series 27: T2 · coronal · 5.0mm · 0.34mm/px · 2 of 32 slices shown (2 of 3)]
[im 1/32]
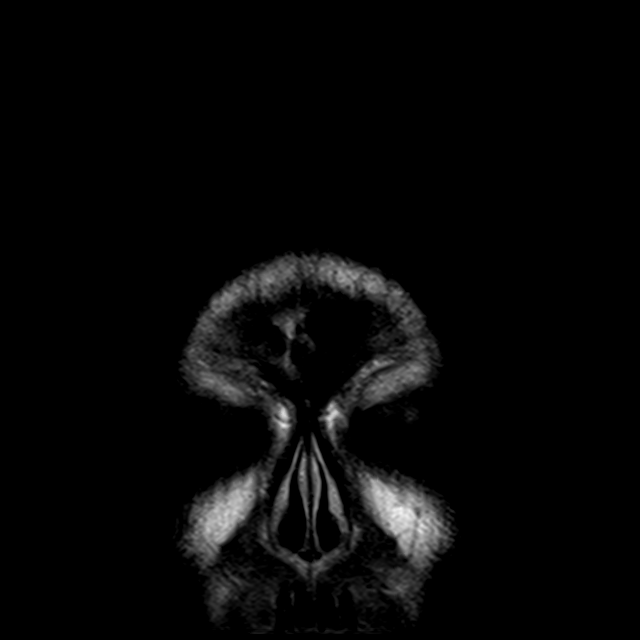
[im 32/32]
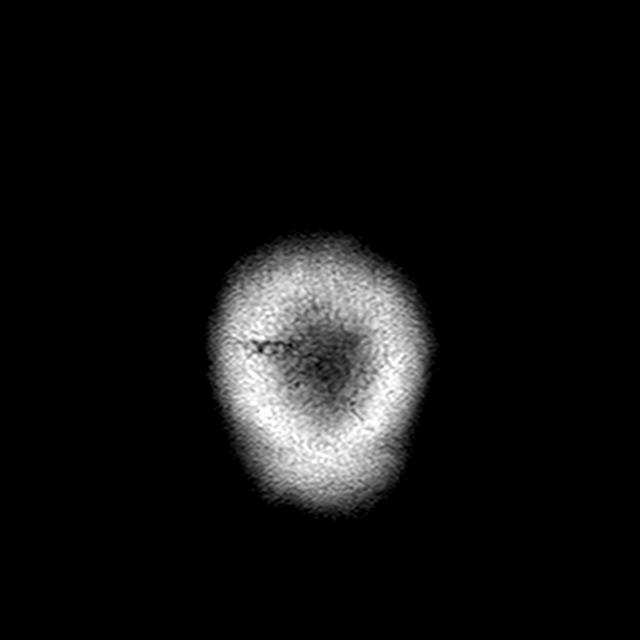

[Series 29: T1 post-contrast · coronal · 5.0mm · 0.34mm/px · 2 of 31 slices shown]
[im 1/31]
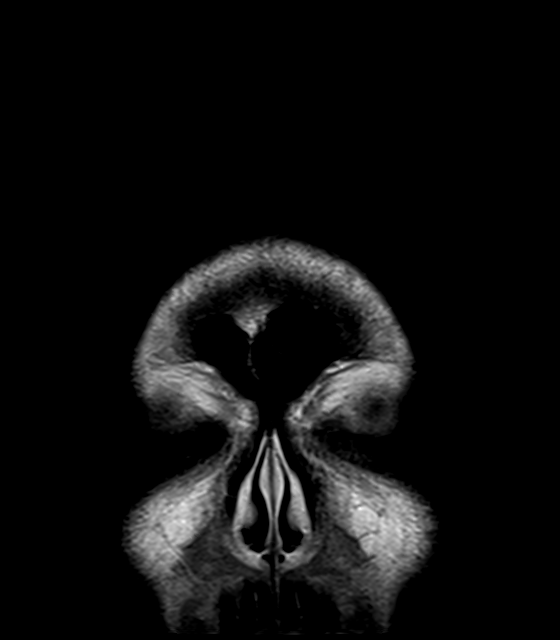
[im 31/31]
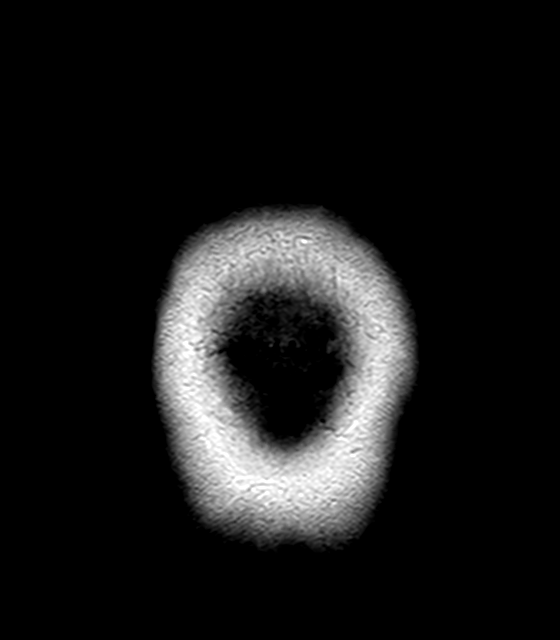

[Series 30: mag_images · axial · 3.0mm · 0.90mm/px · 1 of 16 slices shown (2 of 2)]
[im 1/16]
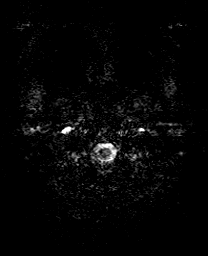

[Series 31: pha_images · axial · 3.0mm · 0.90mm/px · z∈[-64,+110]mm · 3 of 59 slices shown (2 of 2)]
[im 1/59]
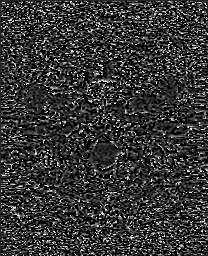
[im 30/59]
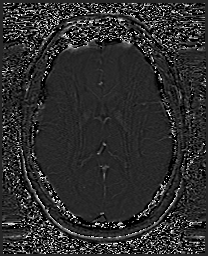
[im 59/59]
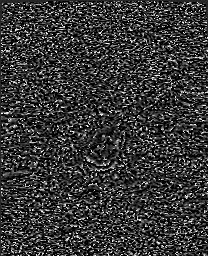

[Series 32: swi_images_nonorm · axial · 3.0mm · 0.90mm/px · z∈[-64,+113]mm · 3 of 60 slices shown (2 of 2)]
[im 1/60]
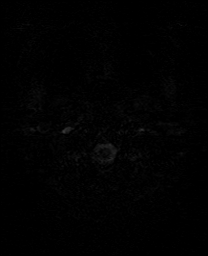
[im 30/60]
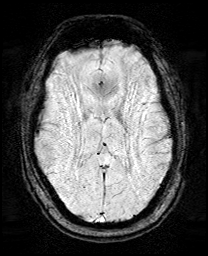
[im 60/60]
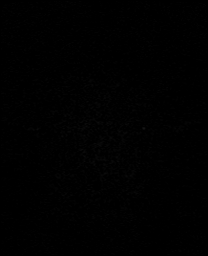

[Series 33: mag_images_nonorm · axial · 3.0mm · 0.90mm/px · 1 of 44 slices shown (2 of 2)]
[im 1/44]
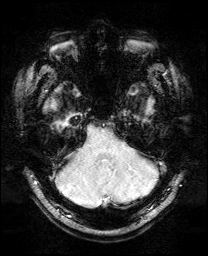

[Series 41: T2 · coronal · 3.0mm · 0.30mm/px · 2 of 34 slices shown (3 of 3)]
[im 1/34]
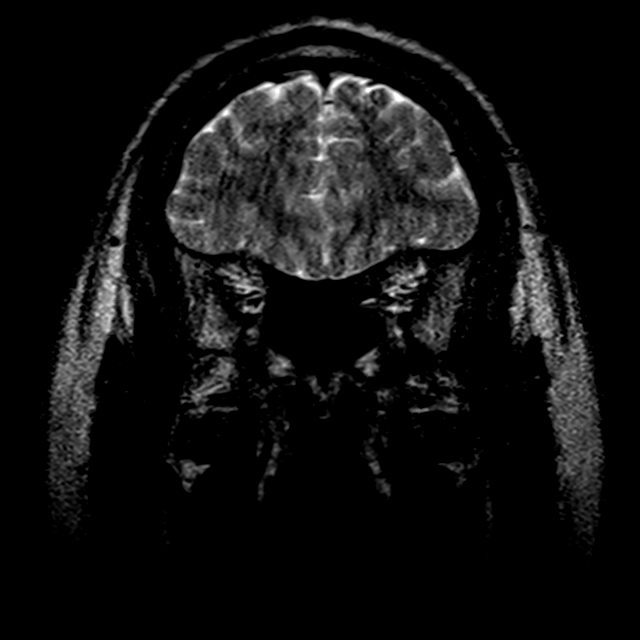
[im 34/34]
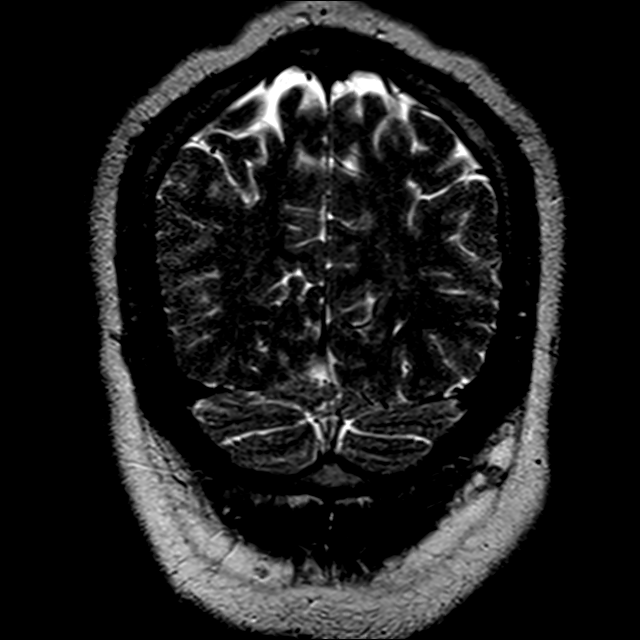

[Series 42: FLAIR · coronal · 3.0mm · 0.59mm/px · 2 of 34 slices shown (2 of 2)]
[im 1/34]
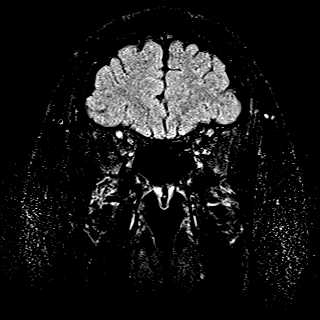
[im 34/34]
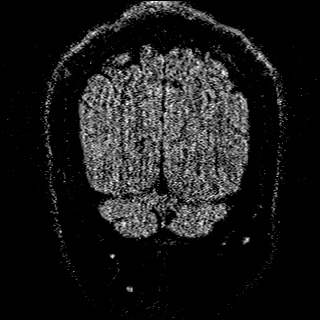

[38 of 48 positions shown; findings below may reference images not displayed]

FINDINGS: Brain: Restricted diffusion present within the hippocampus on the
right, likely postictal phenomenon. The study is considerably
degraded by patient motion. The remainder the brain has a normal
appearance. No evidence of malformation. No pre-existing mesial
temporal abnormality discernible. After contrast administration, no
abnormal enhancement occurs.

Vascular: Major vessels at the base of the brain show flow.

Skull and upper cervical spine: Negative

Sinuses/Orbits: Clear/normal

Other: None
IMPRESSION: Motion degraded exam. Restricted diffusion in the right hippocampus,
most likely indicating postictal phenomenon. No discernible
pre-existing brain lesion.

## 2023-01-25 ENCOUNTER — Other Ambulatory Visit: Payer: Self-pay

## 2023-01-25 DIAGNOSIS — R569 Unspecified convulsions: Secondary | ICD-10-CM

## 2023-01-25 MED ORDER — LEVETIRACETAM ER 750 MG PO TB24: 1500.0000 mg | ORAL_TABLET | Freq: Every day | ORAL | 3 refills | Status: DC

## 2023-02-02 ENCOUNTER — Ambulatory Visit: Payer: BC Managed Care – PPO | Admitting: Family Medicine

## 2023-02-02 ENCOUNTER — Encounter: Payer: Self-pay | Admitting: Family Medicine

## 2023-02-02 VITALS — BP 132/84 | HR 73 | Ht 74.0 in | Wt 266.2 lb

## 2023-02-02 DIAGNOSIS — R569 Unspecified convulsions: Secondary | ICD-10-CM | POA: Diagnosis not present

## 2023-02-02 MED ORDER — LEVETIRACETAM ER 750 MG PO TB24: 1500.0000 mg | ORAL_TABLET | Freq: Every day | ORAL | 3 refills | Status: DC

## 2023-02-02 NOTE — Patient Instructions (Signed)
Below is our plan:  We will continue levetiracetam XR 1500mg  daily.   Please make sure you are consistent with timing of seizure medication. I recommend annual visit with primary care provider (PCP) for complete physical and routine blood work. I recommend daily intake of vitamin D (400-800iu) and calcium (800-1000mg ) for bone health. Discuss Dexa screening with PCP.   According to [place] law, you can not drive unless you are seizure / syncope free for at least 6 months and under physician's care.  Please maintain precautions. Do not participate in activities where a loss of awareness could harm you or someone else. No swimming alone, no tub bathing, no hot tubs, no driving, no operating motorized vehicles (cars, ATVs, motocycles, etc), lawnmowers, power tools or firearms. No standing at heights, such as rooftops, ladders or stairs. Avoid hot objects such as stoves, heaters, open fires. Wear a helmet when riding a bicycle, scooter, skateboard, etc. and avoid areas of traffic. Set your water heater to 120 degrees or less.  SUDEP is the sudden, unexpected death of someone with epilepsy, who was otherwise healthy. In SUDEP cases, no other cause of death is found when an autopsy is done. Each year, more than 1 in 1,000 people with epilepsy die from SUDEP. This is the leading cause of death in people with uncontrolled seizures. Until further answers are available, the best way to prevent SUDEP is to lower your risk by controlling seizures. Research has found that people with all types of epilepsy that experience convulsive seizures can be at risk.  Please make sure you are staying well hydrated. I recommend 50-60 ounces daily. Well balanced diet and regular exercise encouraged. Consistent sleep schedule with 6-8 hours recommended.   Please continue follow up with care team as directed.   Follow up with me in 1 year   You may receive a survey regarding today's visit. I encourage you to leave honest feed back  as I do use this information to improve patient care. Thank you for seeing me today!

## 2023-02-02 NOTE — Progress Notes (Signed)
Chief Complaint  Patient presents with   Follow-up    Pt in room 1. Here for seizure follow up . No recent seizures, no concerns. Pt said Keppra makes his stomach feel weird.     HISTORY OF PRESENT ILLNESS:  02/02/23 ALL:  George Valentine returns for follow up for seizures. He continues lev XR 1500mg  daily. He continues to do well. No seizures. He does have some nausea 1-2 times a month. He feels it passes within 10 minutes and not overly bothersome. He tries to stay well hydrated. He drives without difficulty.   02/02/2022 ALL: George Valentine is a 27 y.o. male here today for follow up for seizures. He continues levetiracetam XR 1500mg  daily. He is doing fairly well. No obvious adverse effects but he does mention having headaches about 2-3 times a week. He describes pain as a pressure sensation that is usually short lived. Sometimes he feels a floating sensation. He reports an unusual sensation in his stomach at times but unable to describe. He feels this happens about twice a month. He denies any seizure activity. He is driving without difficulty. He lives with his mother and sister. He is sleeping well. Usually 11p-7a. He wakes 1-2 times a niht to urinate and reports overactive bladder followed by urology. He does snore but denies apnea concerns. He drinks about 80 ounces of water, daily. 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   HISTORY (copied from Dr Bonnita Hollow previous note)  I had the pleasure of seeing your patient, George Valentine, at [facility] for neurologic consultation regarding his seizures.     He is a 27 year old man with a recent hospitalization for status epilepticus.  Around 3 pm 01/07/2021 he was in the gym and initially felt fine.   While at a gym, he was not feeling well and he asked staff for some Tylenol.  He then started seizing and has no recollection of the rest of that day.  He became unresponsive and EMS was called.  He was seizing when EMS arrived.  He  was given Versed in the field and brought to [facility] emergency room.  He had repeat seizure episodes in the emergency room and received Ativan x2. i he also received dexamethasone and Keppra.  A lumbar puncture was performed but the meningitis work-up including HSV was negative.  In the emergency room, a CT scan was normal.  The first day, he received vancomycin, ceftriaxone and acyclovir.  An MRI of the brain the next day was not consistent with a post seizure restricted diffusion in the right hippocampus.  EEG 01/08/2021 showed continuous slow generalized right temporal activity suggestive of right temporal cortical dysfunction.  There were no seizures or epileptic discharges.   He was discharged 3 days later.   He improved and was discharged on Keppra 1000 mg XR nightly.   He did have some headaches theat work and intermittently since.      He notes the preceding 2-3 days were normal and he had 6 hours of sleep or more.   No fevers.     He took a pre workout energy drink with caffeine and creatine.  He does not think he was dehydrated at the time.    Okay thanks   Initial labs showed increased glucose and creatinine and reduced CO2.   CK was elevated.  WBC were elevated.  Magnesium was normal.  Urine drug screen was consistent (just showed benzodiazepines which she had received)  He had 2 or 3 seizures at age 74 or 46 and also had a seizure at age 42.  He knows he was stressed at that time.   It occurred in the classroom.  He recalls the onset of the seizure with his teacher asking if he was ok and then he lost consciousness and had a GTC seizure.  He did not go to the ED at the time and never saw a doctor about it.      He has a past medical history of overactive bladder treated with an InterStim device in 2019.  An etiology was never found.   Medications had not helped enough.      IMAGING I personally reviewed the CT scan of the head 01/07/2021 and the MRI of the brain 01/08/2021. CT of the head  01/07/2021 was normal.   MRI of the head 01/08/2021 showed restricted diffusion in the right hippocampus.  The brain was otherwise normal.  This is most consistent with transient changes from the seizures   REVIEW OF SYSTEMS: Out of a complete 14 system review of symptoms, the patient complains only of the following symptoms, headaches, unusual sensation in abdomen, urinary frequency and all other reviewed systems are negative.   ALLERGIES: No Known Allergies   HOME MEDICATIONS: Outpatient Medications Prior to Visit  Medication Sig Dispense Refill   levETIRAcetam (KEPPRA XR) 750 MG 24 hr tablet Take 2 tablets (1,500 mg total) by mouth daily. 180 tablet 3   mometasone (ELOCON) 0.1 % cream Apply 1 application topically See admin instructions. Apply to affected area two times a day for no more than 2 weeks (Patient not taking: Reported on 02/02/2023)     No facility-administered medications prior to visit.     PAST MEDICAL HISTORY: Past Medical History:  Diagnosis Date   Overactive bladder      PAST SURGICAL HISTORY: Past Surgical History:  Procedure Laterality Date   implantation intersim  2019   WISDOM TOOTH EXTRACTION     x4     FAMILY HISTORY: Family History  Problem Relation Age of Onset   Diabetes Mother    Cancer - Lung Father      SOCIAL HISTORY: Social History   Socioeconomic History   Marital status: Single    Spouse name: Not on file   Number of children: 0   Years of education: BA   Highest education level: Not on file  Occupational History   Not on file  Tobacco Use   Smoking status: Never   Smokeless tobacco: Never  Substance and Sexual Activity   Alcohol use: Yes    Comment: ocass   Drug use: Never   Sexual activity: Not on file  Other Topics Concern   Not on file  Social History Narrative   Right handed   Caffeine use: 5-6 days per week   Social Determinants of Health   Financial Resource Strain: Not on file  Food Insecurity: Not on  file  Transportation Needs: Not on file  Physical Activity: Not on file  Stress: Not on file  Social Connections: Not on file  Intimate Partner Violence: Not on file     PHYSICAL EXAM  Vitals:   02/02/23 1430  BP: 132/84  Pulse: 73  Weight: 266 lb 3.2 oz (120.7 kg)  Height: 6\' 2"  (1.88 m)    Body mass index is 34.18 kg/m.  Generalized: Well developed, in no acute distress  Cardiology: normal rate and rhythm, no murmur auscultated  Respiratory:  clear to auscultation bilaterally    Neurological examination  Mentation: Alert oriented to time, place, history taking. Follows all commands speech and language fluent Cranial nerve II-XII: Pupils were equal round reactive to light. Extraocular movements were full, visual field were full on confrontational test. Facial sensation and strength were normal. Uvula tongue midline. Head turning and shoulder shrug  were normal and symmetric. Motor: The motor testing reveals 5 over 5 strength of all 4 extremities. Good symmetric motor tone is noted throughout.  Sensory: Sensory testing is intact to soft touch on all 4 extremities. No evidence of extinction is noted.  Coordination: Cerebellar testing reveals good finger-nose-finger and heel-to-shin bilaterally.  Gait and station: Gait is normal. Tandem gait is normal. Romberg is negative. No drift is seen.  Reflexes: Deep tendon reflexes are symmetric and normal bilaterally.    DIAGNOSTIC DATA (LABS, IMAGING, TESTING) - I reviewed patient records, labs, notes, testing and imaging myself where available.  Lab Results  Component Value Date   WBC 7.6 01/09/2021   HGB 13.2 01/09/2021   HCT 40.5 01/09/2021   MCV 81.8 01/09/2021   PLT 225 01/09/2021      Component Value Date/Time   NA 139 01/09/2021 0319   K 3.5 01/09/2021 0319   CL 109 01/09/2021 0319   CO2 24 01/09/2021 0319   GLUCOSE 100 (H) 01/09/2021 0319   BUN 8 01/09/2021 0319   CREATININE 1.01 01/09/2021 0319   CALCIUM 8.5 (L)  01/09/2021 0319   PROT 7.0 01/07/2021 1516   ALBUMIN 4.3 01/07/2021 1516   AST 35 01/07/2021 1516   ALT 23 01/07/2021 1516   ALKPHOS 91 01/07/2021 1516   BILITOT 0.9 01/07/2021 1516   GFRNONAA >60 01/09/2021 0319   No results found for: "CHOL", "HDL", "LDLCALC", "LDLDIRECT", "TRIG", "CHOLHDL" Lab Results  Component Value Date   HGBA1C 5.5 01/08/2021   No results found for: "VITAMINB12" No results found for: "TSH"      No data to display               No data to display           ASSESSMENT AND PLAN  27 y.o. year old male  has a past medical history of Overactive bladder. here with    Seizure (HCC) - Plan: levETIRAcetam (KEPPRA XR) 750 MG 24 hr tablet  George Valentine is doing well, today. No seizure activity since continuing levetiracetam XR 1500mg  daily. He does report nausea 1-2 times a month. It is unclear if symptoms are related to AED. He is willing to continue monitoring symptoms. He will continue close follow up with PCP and urology. Healthy lifestyle habits encouraged. He will follow up with PCP as directed. He will return to see me in 1 year, sooner if needed. He verbalizes understanding and agreement with this plan.    No orders of the defined types were placed in this encounter.    Meds ordered this encounter  Medications   levETIRAcetam (KEPPRA XR) 750 MG 24 hr tablet    Sig: Take 2 tablets (1,500 mg total) by mouth daily.    Dispense:  180 tablet    Refill:  3    Order Specific Question:   Supervising Provider    Answer:   Anson Fret [[id number]]     Shawnie Dapper, MSN, FNP-C 02/02/2023, 4:02 PM  [facility] [address] [place], [place] [address] [phone]

## 2023-03-29 ENCOUNTER — Telehealth: Payer: Self-pay | Admitting: Family Medicine

## 2023-03-29 NOTE — Telephone Encounter (Signed)
Call to patient, advised when goes for surgical consult to let them know he is under care of Amy Lomax at [facility] and they would typically fax a surgical clearance form. Patient appreciative of call and information

## 2023-03-29 NOTE — Telephone Encounter (Signed)
Pt asking is it ok to go under local anesthesia with the medications I am on. Would like a call from the nurse.

## 2024-01-31 ENCOUNTER — Telehealth: Payer: Self-pay | Admitting: Family Medicine

## 2024-01-31 NOTE — Progress Notes (Deleted)
 No chief complaint on file.   HISTORY OF PRESENT ILLNESS:  01/31/24 ALL:  George Valentine returns for follow up for seizures. He continues lev XR 1500mg  daily.   02/02/2023 ALL:  George Valentine returns for follow up for seizures. He continues lev XR 1500mg  daily. He continues to do well. No seizures. He does have some nausea 1-2 times a month. He feels it passes within 10 minutes and not overly bothersome. He tries to stay well hydrated. He drives without difficulty.   02/02/2022 ALL: George Valentine is a 28 y.o. male here today for follow up for seizures. He continues levetiracetam  XR 1500mg  daily. He is doing fairly well. No obvious adverse effects but he does mention having headaches about 2-3 times a week. He describes pain as a pressure sensation that is usually short lived. Sometimes he feels a floating sensation. He reports an unusual sensation in his stomach at times but unable to describe. He feels this happens about twice a month. He denies any seizure activity. He is driving without difficulty. He lives with his mother and sister. He is sleeping well. Usually 11p-7a. He wakes 1-2 times a niht to urinate and reports overactive bladder followed by urology. He does snore but denies apnea concerns. He drinks about 80 ounces of water, daily. 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   HISTORY (copied from Dr Duncan previous note)  I had the pleasure of seeing your patient, George Valentine, at [facility] for neurologic consultation regarding his seizures.     He is a 28 year old man with a recent hospitalization for status epilepticus.  Around 3 pm 01/07/2021 he was in the gym and initially felt fine.   While at a gym, he was not feeling well and he asked staff for some Tylenol .  He then started seizing and has no recollection of the rest of that day.  He became unresponsive and EMS was called.  He was seizing when EMS arrived.  He was given Versed in the field and brought to [facility] emergency room.  He had repeat seizure episodes in the emergency room and received Ativan  x2. i he also received dexamethasone  and Keppra .  A lumbar puncture was performed but the meningitis work-up including HSV was negative.  In the emergency room, a CT scan was normal.  The first day, he received vancomycin , ceftriaxone  and acyclovir .  An MRI of the brain the next day was not consistent with a post seizure restricted diffusion in the right hippocampus.  EEG 01/08/2021 showed continuous slow generalized right temporal activity suggestive of right temporal cortical dysfunction.  There were no seizures or epileptic discharges.   He was discharged 3 days later.   He improved and was discharged on Keppra  1000 mg XR nightly.   He did have some headaches theat work and intermittently since.      He notes the preceding 2-3 days were normal and he had 6 hours of sleep or more.   No fevers.     He took a pre workout energy drink with caffeine and creatine.  He does not think he was dehydrated at the time.    Okay thanks   Initial labs showed increased glucose and creatinine and reduced CO2.   CK was elevated.  WBC were elevated.  Magnesium was normal.  Urine drug screen was consistent (just showed benzodiazepines which she had received)   He had 2 or 3 seizures at age 36 or 52 and  also had a seizure at age 4.  He knows he was stressed at that time.   It occurred in the classroom.  He recalls the onset of the seizure with his teacher asking if he was ok and then he lost consciousness and had a GTC seizure.  He did not go to the ED at the time and never saw a doctor about it.      He has a past medical history of overactive bladder treated with an InterStim device in 2019.  An etiology was never found.   Medications had not helped enough.      IMAGING I personally reviewed the CT scan of the head 01/07/2021 and the MRI of the brain 01/08/2021. CT of the head 01/07/2021 was normal.   MRI of the head 01/08/2021  showed restricted diffusion in the right hippocampus.  The brain was otherwise normal.  This is most consistent with transient changes from the seizures   REVIEW OF SYSTEMS: Out of a complete 14 system review of symptoms, the patient complains only of the following symptoms, headaches, unusual sensation in abdomen, urinary frequency and all other reviewed systems are negative.   ALLERGIES: No Known Allergies   HOME MEDICATIONS: Outpatient Medications Prior to Visit  Medication Sig Dispense Refill   levETIRAcetam  (KEPPRA  XR) 750 MG 24 hr tablet Take 2 tablets (1,500 mg total) by mouth daily. 180 tablet 3   mometasone (ELOCON) 0.1 % cream Apply 1 application topically See admin instructions. Apply to affected area two times a day for no more than 2 weeks (Patient not taking: Reported on 02/02/2023)     No facility-administered medications prior to visit.     PAST MEDICAL HISTORY: Past Medical History:  Diagnosis Date   Overactive bladder      PAST SURGICAL HISTORY: Past Surgical History:  Procedure Laterality Date   implantation intersim  2019   WISDOM TOOTH EXTRACTION     x4     FAMILY HISTORY: Family History  Problem Relation Age of Onset   Diabetes Mother    Cancer - Lung Father      SOCIAL HISTORY: Social History   Socioeconomic History   Marital status: Single    Spouse name: Not on file   Number of children: 0   Years of education: BA   Highest education level: Not on file  Occupational History   Not on file  Tobacco Use   Smoking status: Never   Smokeless tobacco: Never  Substance and Sexual Activity   Alcohol use: Yes    Comment: ocass   Drug use: Never   Sexual activity: Not on file  Other Topics Concern   Not on file  Social History Narrative   Right handed   Caffeine use: 5-6 days per week   Social Drivers of Health   Financial Resource Strain: Not on file  Food Insecurity: Low Risk  (11/08/2023)   Received from [facility]   Hunger  Vital Sign    Within the past 12 months, you worried that your food would run out before you got money to buy more: Never true    Within the past 12 months, the food you bought just didn't last and you didn't have money to get more. : Never true  Transportation Needs: No Transportation Needs (11/08/2023)   Received from [facility]    In the past 12 months, has lack of reliable transportation kept you from medical appointments, meetings, work or from getting  things needed for daily living? : No  Physical Activity: Not on file  Stress: Not on file  Social Connections: Not on file  Intimate Partner Violence: Not on file     PHYSICAL EXAM  There were no vitals filed for this visit.   There is no height or weight on file to calculate BMI.  Generalized: Well developed, in no acute distress  Cardiology: normal rate and rhythm, no murmur auscultated  Respiratory: clear to auscultation bilaterally    Neurological examination  Mentation: Alert oriented to time, place, history taking. Follows all commands speech and language fluent Cranial nerve II-XII: Pupils were equal round reactive to light. Extraocular movements were full, visual field were full on confrontational test. Facial sensation and strength were normal. Uvula tongue midline. Head turning and shoulder shrug  were normal and symmetric. Motor: The motor testing reveals 5 over 5 strength of all 4 extremities. Good symmetric motor tone is noted throughout.  Sensory: Sensory testing is intact to soft touch on all 4 extremities. No evidence of extinction is noted.  Coordination: Cerebellar testing reveals good finger-nose-finger and heel-to-shin bilaterally.  Gait and station: Gait is normal. Tandem gait is normal. Romberg is negative. No drift is seen.  Reflexes: Deep tendon reflexes are symmetric and normal bilaterally.    DIAGNOSTIC DATA (LABS, IMAGING, TESTING) - I reviewed patient records, labs, notes, testing  and imaging myself where available.  Lab Results  Component Value Date   WBC 7.6 01/09/2021   HGB 13.2 01/09/2021   HCT 40.5 01/09/2021   MCV 81.8 01/09/2021   PLT 225 01/09/2021      Component Value Date/Time   NA 139 01/09/2021 0319   K 3.5 01/09/2021 0319   CL 109 01/09/2021 0319   CO2 24 01/09/2021 0319   GLUCOSE 100 (H) 01/09/2021 0319   BUN 8 01/09/2021 0319   CREATININE 1.01 01/09/2021 0319   CALCIUM 8.5 (L) 01/09/2021 0319   PROT 7.0 01/07/2021 1516   ALBUMIN 4.3 01/07/2021 1516   AST 35 01/07/2021 1516   ALT 23 01/07/2021 1516   ALKPHOS 91 01/07/2021 1516   BILITOT 0.9 01/07/2021 1516   GFRNONAA >60 01/09/2021 0319   No results found for: CHOL, HDL, LDLCALC, LDLDIRECT, TRIG, CHOLHDL Lab Results  Component Value Date   HGBA1C 5.5 01/08/2021   No results found for: VITAMINB12 No results found for: TSH      No data to display               No data to display           ASSESSMENT AND PLAN  28 y.o. year old male  has a past medical history of Overactive bladder. here with    No diagnosis found.  George Valentine is doing well, today. No seizure activity since continuing levetiracetam  XR 1500mg  daily. He does report nausea 1-2 times a month. It is unclear if symptoms are related to AED. He is willing to continue monitoring symptoms. He will continue close follow up with PCP and urology. Healthy lifestyle habits encouraged. He will follow up with PCP as directed. He will return to see me in 1 year, sooner if needed. He verbalizes understanding and agreement with this plan.    No orders of the defined types were placed in this encounter.    No orders of the defined types were placed in this encounter.    Greig Forbes, MSN, FNP-C 01/31/2024, 10:10 AM  [facility] [address] [place], [place] [address] [phone]

## 2024-01-31 NOTE — Telephone Encounter (Signed)
 Request to r/s appointment

## 2024-01-31 NOTE — Patient Instructions (Incomplete)
 Below is our plan:  We will ***  Please make sure you are consistent with timing of seizure medication. I recommend annual visit with primary care provider (PCP) for complete physical and routine blood work. I recommend daily intake of vitamin D (400-800iu) and calcium (800-1000mg ) for bone health. Discuss Dexa screening with PCP.   According to [place] law, you can not drive unless you are seizure / syncope free for at least 6 months and under physician's care.  Please maintain precautions. Do not participate in activities where a loss of awareness could harm you or someone else. No swimming alone, no tub bathing, no hot tubs, no driving, no operating motorized vehicles (cars, ATVs, motocycles, etc), lawnmowers, power tools or firearms. No standing at heights, such as rooftops, ladders or stairs. Avoid hot objects such as stoves, heaters, open fires. Wear a helmet when riding a bicycle, scooter, skateboard, etc. and avoid areas of traffic. Set your water heater to 120 degrees or less.  SUDEP is the sudden, unexpected death of someone with epilepsy, who was otherwise healthy. In SUDEP cases, no other cause of death is found when an autopsy is done. Each year, more than 1 in 1,000 people with epilepsy die from SUDEP. This is the leading cause of death in people with uncontrolled seizures. Until further answers are available, the best way to prevent SUDEP is to lower your risk by controlling seizures. Research has found that people with all types of epilepsy that experience convulsive seizures can be at risk.  Please make sure you are staying well hydrated. I recommend 50-60 ounces daily. Well balanced diet and regular exercise encouraged. Consistent sleep schedule with 6-8 hours recommended.   Please continue follow up with care team as directed.   Follow up with *** in ***  You may receive a survey regarding today's visit. I encourage you to leave honest feed back as I do use this information to improve  patient care. Thank you for seeing me today!

## 2024-02-01 ENCOUNTER — Ambulatory Visit: Payer: BC Managed Care – PPO | Admitting: Family Medicine

## 2024-02-22 ENCOUNTER — Other Ambulatory Visit: Payer: Self-pay

## 2024-03-31 NOTE — Progress Notes (Unsigned)
 No chief complaint on file.   HISTORY OF PRESENT ILLNESS:  03/31/24 ALL:  George Valentine returns for follow up for seizures. He continues lev XR 1500mg  daily.   01/30/2023 ALL: George Valentine returns for follow up for seizures. He continues lev XR 1500mg  daily. He continues to do well. No seizures. He does have some nausea 1-2 times a month. He feels it passes within 10 minutes and not overly bothersome. He tries to stay well hydrated. He drives without difficulty.   02/02/2022 ALL: George Valentine is a 28 y.o. male here today for follow up for seizures. He continues levetiracetam  XR 1500mg  daily. He is doing fairly well. No obvious adverse effects but he does mention having headaches about 2-3 times a week. He describes pain as a pressure sensation that is usually short lived. Sometimes he feels a floating sensation. He reports an unusual sensation in his stomach at times but unable to describe. He feels this happens about twice a month. He denies any seizure activity. He is driving without difficulty. He lives with his mother and sister. He is sleeping well. Usually 11p-7a. He wakes 1-2 times a niht to urinate and reports overactive bladder followed by urology. He does snore but denies apnea concerns. He drinks about 80 ounces of water, daily. 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   HISTORY (copied from Dr Duncan previous note)  I had the pleasure of seeing your patient, George Valentine, at [facility] for neurologic consultation regarding his seizures.     He is a 28 year old man with a recent hospitalization for status epilepticus.  Around 3 pm 01/07/2021 he was in the gym and initially felt fine.   While at a gym, he was not feeling well and he asked staff for some Tylenol .  He then started seizing and has no recollection of the rest of that day.  He became unresponsive and EMS was called.  He was seizing when EMS arrived.  He was given Versed in the field and brought to [facility] emergency room.  He had repeat seizure episodes in the emergency room and received Ativan  x2. i he also received dexamethasone  and Keppra .  A lumbar puncture was performed but the meningitis work-up including HSV was negative.  In the emergency room, a CT scan was normal.  The first day, he received vancomycin , ceftriaxone  and acyclovir .  An MRI of the brain the next day was not consistent with a post seizure restricted diffusion in the right hippocampus.  EEG 01/08/2021 showed continuous slow generalized right temporal activity suggestive of right temporal cortical dysfunction.  There were no seizures or epileptic discharges.   He was discharged 3 days later.   He improved and was discharged on Keppra  1000 mg XR nightly.   He did have some headaches theat work and intermittently since.      He notes the preceding 2-3 days were normal and he had 6 hours of sleep or more.   No fevers.     He took a pre workout energy drink with caffeine and creatine.  He does not think he was dehydrated at the time.    Okay thanks   Initial labs showed increased glucose and creatinine and reduced CO2.   CK was elevated.  WBC were elevated.  Magnesium was normal.  Urine drug screen was consistent (just showed benzodiazepines which she had received)   He had 2 or 3 seizures at age 40 or 4 and also  had a seizure at age 89.  He knows he was stressed at that time.   It occurred in the classroom.  He recalls the onset of the seizure with his teacher asking if he was ok and then he lost consciousness and had a GTC seizure.  He did not go to the ED at the time and never saw a doctor about it.      He has a past medical history of overactive bladder treated with an InterStim device in 2019.  An etiology was never found.   Medications had not helped enough.      IMAGING I personally reviewed the CT scan of the head 01/07/2021 and the MRI of the brain 01/08/2021. CT of the head 01/07/2021 was normal.   MRI of the head 01/08/2021  showed restricted diffusion in the right hippocampus.  The brain was otherwise normal.  This is most consistent with transient changes from the seizures   REVIEW OF SYSTEMS: Out of a complete 14 system review of symptoms, the patient complains only of the following symptoms, headaches, unusual sensation in abdomen, urinary frequency and all other reviewed systems are negative.   ALLERGIES: No Known Allergies   HOME MEDICATIONS: Outpatient Medications Prior to Visit  Medication Sig Dispense Refill   levETIRAcetam  (KEPPRA  XR) 750 MG 24 hr tablet Take 2 tablets (1,500 mg total) by mouth daily. 180 tablet 3   mometasone (ELOCON) 0.1 % cream Apply 1 application topically See admin instructions. Apply to affected area two times a day for no more than 2 weeks (Patient not taking: Reported on 02/02/2023)     No facility-administered medications prior to visit.     PAST MEDICAL HISTORY: Past Medical History:  Diagnosis Date   Overactive bladder      PAST SURGICAL HISTORY: Past Surgical History:  Procedure Laterality Date   implantation intersim  2019   WISDOM TOOTH EXTRACTION     x4     FAMILY HISTORY: Family History  Problem Relation Age of Onset   Diabetes Mother    Cancer - Lung Father      SOCIAL HISTORY: Social History   Socioeconomic History   Marital status: Single    Spouse name: Not on file   Number of children: 0   Years of education: BA   Highest education level: Not on file  Occupational History   Not on file  Tobacco Use   Smoking status: Never   Smokeless tobacco: Never  Substance and Sexual Activity   Alcohol use: Yes    Comment: ocass   Drug use: Never   Sexual activity: Not on file  Other Topics Concern   Not on file  Social History Narrative   Right handed   Caffeine use: 5-6 days per week   Social Drivers of Health   Financial Resource Strain: Not on file  Food Insecurity: Low Risk  (11/08/2023)   Received from [facility]   Hunger  Vital Sign    Within the past 12 months, you worried that your food would run out before you got money to buy more: Never true    Within the past 12 months, the food you bought just didn't last and you didn't have money to get more. : Never true  Transportation Needs: No Transportation Needs (11/08/2023)   Received from [facility]    In the past 12 months, has lack of reliable transportation kept you from medical appointments, meetings, work or from getting things  needed for daily living? : No  Physical Activity: Not on file  Stress: Not on file  Social Connections: Not on file  Intimate Partner Violence: Not on file     PHYSICAL EXAM  There were no vitals filed for this visit.   There is no height or weight on file to calculate BMI.  Generalized: Well developed, in no acute distress  Cardiology: normal rate and rhythm, no murmur auscultated  Respiratory: clear to auscultation bilaterally    Neurological examination  Mentation: Alert oriented to time, place, history taking. Follows all commands speech and language fluent Cranial nerve II-XII: Pupils were equal round reactive to light. Extraocular movements were full, visual field were full on confrontational test. Facial sensation and strength were normal. Uvula tongue midline. Head turning and shoulder shrug  were normal and symmetric. Motor: The motor testing reveals 5 over 5 strength of all 4 extremities. Good symmetric motor tone is noted throughout.  Sensory: Sensory testing is intact to soft touch on all 4 extremities. No evidence of extinction is noted.  Coordination: Cerebellar testing reveals good finger-nose-finger and heel-to-shin bilaterally.  Gait and station: Gait is normal. Tandem gait is normal. Romberg is negative. No drift is seen.  Reflexes: Deep tendon reflexes are symmetric and normal bilaterally.    DIAGNOSTIC DATA (LABS, IMAGING, TESTING) - I reviewed patient records, labs, notes, testing  and imaging myself where available.  Lab Results  Component Value Date   WBC 7.6 01/09/2021   HGB 13.2 01/09/2021   HCT 40.5 01/09/2021   MCV 81.8 01/09/2021   PLT 225 01/09/2021      Component Value Date/Time   NA 139 01/09/2021 0319   K 3.5 01/09/2021 0319   CL 109 01/09/2021 0319   CO2 24 01/09/2021 0319   GLUCOSE 100 (H) 01/09/2021 0319   BUN 8 01/09/2021 0319   CREATININE 1.01 01/09/2021 0319   CALCIUM 8.5 (L) 01/09/2021 0319   PROT 7.0 01/07/2021 1516   ALBUMIN 4.3 01/07/2021 1516   AST 35 01/07/2021 1516   ALT 23 01/07/2021 1516   ALKPHOS 91 01/07/2021 1516   BILITOT 0.9 01/07/2021 1516   GFRNONAA >60 01/09/2021 0319   No results found for: CHOL, HDL, LDLCALC, LDLDIRECT, TRIG, CHOLHDL Lab Results  Component Value Date   HGBA1C 5.5 01/08/2021   No results found for: VITAMINB12 No results found for: TSH      No data to display               No data to display           ASSESSMENT AND PLAN  28 y.o. year old male  has a past medical history of Overactive bladder. here with    No diagnosis found.  George Valentine is doing well, today. No seizure activity since continuing levetiracetam  XR 1500mg  daily. He does report nausea 1-2 times a month. It is unclear if symptoms are related to AED. He is willing to continue monitoring symptoms. He will continue close follow up with PCP and urology. Healthy lifestyle habits encouraged. He will follow up with PCP as directed. He will return to see me in 1 year, sooner if needed. He verbalizes understanding and agreement with this plan.    No orders of the defined types were placed in this encounter.    No orders of the defined types were placed in this encounter.    Greig Forbes, MSN, FNP-C 03/31/2024, 7:50 PM  [facility] [address], [address]  [place], [place] [address] [phone]

## 2024-03-31 NOTE — Patient Instructions (Incomplete)
 Below is our plan:  We will continue levetiracetam  XR 1500mg  daily.   Please make sure you are consistent with timing of seizure medication. I recommend annual visit with primary care provider (PCP) for complete physical and routine blood work. I recommend daily intake of vitamin D (400-800iu) and calcium (800-1000mg ) for bone health. Discuss Dexa screening with PCP.   According to [place] law, you can not drive unless you are seizure / syncope free for at least 6 months and under physician's care.  Please maintain precautions. Do not participate in activities where a loss of awareness could harm you or someone else. No swimming alone, no tub bathing, no hot tubs, no driving, no operating motorized vehicles (cars, ATVs, motocycles, etc), lawnmowers, power tools or firearms. No standing at heights, such as rooftops, ladders or stairs. Avoid hot objects such as stoves, heaters, open fires. Wear a helmet when riding a bicycle, scooter, skateboard, etc. and avoid areas of traffic. Set your water heater to 120 degrees or less.  SUDEP is the sudden, unexpected death of someone with epilepsy, who was otherwise healthy. In SUDEP cases, no other cause of death is found when an autopsy is done. Each year, more than 1 in 1,000 people with epilepsy die from SUDEP. This is the leading cause of death in people with uncontrolled seizures. Until further answers are available, the best way to prevent SUDEP is to lower your risk by controlling seizures. Research has found that people with all types of epilepsy that experience convulsive seizures can be at risk.  Please make sure you are staying well hydrated. I recommend 50-60 ounces daily. Well balanced diet and regular exercise encouraged. Consistent sleep schedule with 6-8 hours recommended.   Please continue follow up with care team as directed.   Follow up with me in 1 year   You may receive a survey regarding today's visit. I encourage you to leave honest feed back  as I do use this information to improve patient care. Thank you for seeing me today!

## 2024-04-01 ENCOUNTER — Ambulatory Visit: Admitting: Family Medicine

## 2024-04-01 ENCOUNTER — Encounter: Payer: Self-pay | Admitting: Family Medicine

## 2024-04-01 VITALS — BP 146/90 | HR 65 | Ht 74.0 in | Wt 256.0 lb

## 2024-04-01 DIAGNOSIS — R569 Unspecified convulsions: Secondary | ICD-10-CM | POA: Diagnosis not present

## 2024-04-01 MED ORDER — LEVETIRACETAM ER 750 MG PO TB24: 1500.0000 mg | ORAL_TABLET | Freq: Every day | ORAL | 3 refills | Status: AC

## 2024-05-20 ENCOUNTER — Ambulatory Visit: Admitting: Family Medicine

## 2024-06-04 ENCOUNTER — Encounter: Payer: Self-pay | Admitting: Family Medicine

## 2025-04-07 ENCOUNTER — Ambulatory Visit: Admitting: Family Medicine
